# Patient Record
Sex: Female | Born: 1970 | Race: Black or African American | Hispanic: No | Smoking: Never smoker
Health system: Southern US, Community
[De-identification: ages and names within clinical notes are randomized; demographics above are authoritative.]

## PROBLEM LIST (undated history)

## (undated) DIAGNOSIS — M810 Age-related osteoporosis without current pathological fracture: Secondary | ICD-10-CM

## (undated) DIAGNOSIS — Z9889 Other specified postprocedural states: Secondary | ICD-10-CM

## (undated) DIAGNOSIS — R112 Nausea with vomiting, unspecified: Secondary | ICD-10-CM

## (undated) DIAGNOSIS — S8331XA Tear of articular cartilage of right knee, current, initial encounter: Secondary | ICD-10-CM

## (undated) DIAGNOSIS — J45909 Unspecified asthma, uncomplicated: Secondary | ICD-10-CM

## (undated) HISTORY — PX: OTHER SURGICAL HISTORY: SHX169

## (undated) HISTORY — PX: ABDOMINAL HYSTERECTOMY: SHX81

## (undated) HISTORY — PX: ABDOMINAL SURGERY: SHX537

---

## 2006-10-30 HISTORY — PX: GASTRIC BYPASS: SHX52

## 2006-10-30 HISTORY — PX: HERNIA REPAIR: SHX51

## 2007-03-19 ENCOUNTER — Ambulatory Visit: Payer: Self-pay | Admitting: Oncology

## 2007-04-26 ENCOUNTER — Ambulatory Visit (HOSPITAL_COMMUNITY): Admission: RE | Admit: 2007-04-26 | Discharge: 2007-04-26 | Payer: Self-pay | Admitting: *Deleted

## 2007-05-14 ENCOUNTER — Ambulatory Visit: Payer: Self-pay | Admitting: Oncology

## 2007-05-16 LAB — IRON AND TIBC
%SAT: 43 % (ref 20–55)
Iron: 112 ug/dL (ref 42–145)
UIBC: 150 ug/dL

## 2007-05-16 LAB — COMPREHENSIVE METABOLIC PANEL
AST: 22 U/L (ref 0–37)
Alkaline Phosphatase: 47 U/L (ref 39–117)
BUN: 14 mg/dL (ref 6–23)
Chloride: 105 mEq/L (ref 96–112)
Creatinine, Ser: 0.66 mg/dL (ref 0.40–1.20)
Glucose, Bld: 80 mg/dL (ref 70–99)
Potassium: 3.8 mEq/L (ref 3.5–5.3)
Sodium: 139 mEq/L (ref 135–145)
Total Bilirubin: 1.8 mg/dL — ABNORMAL HIGH (ref 0.3–1.2)
Total Protein: 6.3 g/dL (ref 6.0–8.3)

## 2007-08-05 ENCOUNTER — Emergency Department (HOSPITAL_COMMUNITY): Admission: EM | Admit: 2007-08-05 | Discharge: 2007-08-05 | Payer: Self-pay | Admitting: Emergency Medicine

## 2007-08-20 ENCOUNTER — Ambulatory Visit: Payer: Self-pay | Admitting: Oncology

## 2007-08-27 LAB — COMPREHENSIVE METABOLIC PANEL
AST: 16 U/L (ref 0–37)
Alkaline Phosphatase: 55 U/L (ref 39–117)
BUN: 11 mg/dL (ref 6–23)
CO2: 24 mEq/L (ref 19–32)
Calcium: 8.9 mg/dL (ref 8.4–10.5)
Creatinine, Ser: 0.64 mg/dL (ref 0.40–1.20)
Potassium: 3.7 mEq/L (ref 3.5–5.3)

## 2007-08-27 LAB — CBC WITH DIFFERENTIAL/PLATELET
Basophils Absolute: 0 10*3/uL (ref 0.0–0.1)
EOS%: 3.7 % (ref 0.0–7.0)
HGB: 12.4 g/dL (ref 11.6–15.9)
LYMPH%: 54.4 % — ABNORMAL HIGH (ref 14.0–48.0)
MCH: 31.9 pg (ref 26.0–34.0)
MCV: 90.3 fL (ref 81.0–101.0)
MONO%: 5.5 % (ref 0.0–13.0)
NEUT#: 1.6 10*3/uL (ref 1.5–6.5)
Platelets: 235 10*3/uL (ref 145–400)
RBC: 3.9 10*6/uL (ref 3.70–5.32)
WBC: 4.5 10*3/uL (ref 3.9–10.0)
lymph#: 2.5 10*3/uL (ref 0.9–3.3)

## 2007-08-27 LAB — IRON AND TIBC
TIBC: 267 ug/dL (ref 250–470)
UIBC: 176 ug/dL

## 2008-02-18 ENCOUNTER — Ambulatory Visit: Payer: Self-pay | Admitting: Oncology

## 2008-03-12 LAB — CBC WITH DIFFERENTIAL/PLATELET
BASO%: 0.5 % (ref 0.0–2.0)
Basophils Absolute: 0 10*3/uL (ref 0.0–0.1)
EOS%: 3.2 % (ref 0.0–7.0)
LYMPH%: 52 % — ABNORMAL HIGH (ref 14.0–48.0)
MCH: 31.2 pg (ref 26.0–34.0)
MCHC: 35.3 g/dL (ref 32.0–36.0)
MONO#: 0.2 10*3/uL (ref 0.1–0.9)
MONO%: 5.3 % (ref 0.0–13.0)
NEUT#: 1.4 10*3/uL — ABNORMAL LOW (ref 1.5–6.5)
RBC: 3.97 10*6/uL (ref 3.70–5.32)
WBC: 3.5 10*3/uL — ABNORMAL LOW (ref 3.9–10.0)

## 2008-03-12 LAB — COMPREHENSIVE METABOLIC PANEL
ALT: 30 U/L (ref 0–35)
AST: 37 U/L (ref 0–37)
Alkaline Phosphatase: 53 U/L (ref 39–117)
Creatinine, Ser: 0.74 mg/dL (ref 0.40–1.20)
Glucose, Bld: 79 mg/dL (ref 70–99)
Potassium: 3.9 mEq/L (ref 3.5–5.3)
Sodium: 141 mEq/L (ref 135–145)

## 2008-03-26 ENCOUNTER — Ambulatory Visit (HOSPITAL_COMMUNITY): Admission: RE | Admit: 2008-03-26 | Discharge: 2008-03-26 | Payer: Self-pay | Admitting: Oncology

## 2008-07-28 ENCOUNTER — Ambulatory Visit: Payer: Self-pay | Admitting: Internal Medicine

## 2008-07-28 DIAGNOSIS — J329 Chronic sinusitis, unspecified: Secondary | ICD-10-CM | POA: Insufficient documentation

## 2008-07-28 DIAGNOSIS — L509 Urticaria, unspecified: Secondary | ICD-10-CM

## 2008-07-28 LAB — CONVERTED CEMR LAB
Basophils Absolute: 0 10*3/uL (ref 0.0–0.1)
Eosinophils Absolute: 0.2 10*3/uL (ref 0.0–0.7)
Eosinophils Relative: 4.1 % (ref 0.0–5.0)
HCT: 38.5 % (ref 36.0–46.0)
Hemoglobin: 13 g/dL (ref 12.0–15.0)
IgE (Immunoglobulin E), Serum: 17 intl units/mL (ref 0.0–180.0)
Lymphocytes Relative: 54.9 % — ABNORMAL HIGH (ref 12.0–46.0)
Neutrophils Relative %: 36.1 % — ABNORMAL LOW (ref 43.0–77.0)
WBC: 4.8 10*3/uL (ref 4.5–10.5)

## 2008-08-08 DIAGNOSIS — J45909 Unspecified asthma, uncomplicated: Secondary | ICD-10-CM | POA: Insufficient documentation

## 2008-08-08 DIAGNOSIS — J309 Allergic rhinitis, unspecified: Secondary | ICD-10-CM | POA: Insufficient documentation

## 2008-08-18 ENCOUNTER — Ambulatory Visit: Payer: Self-pay | Admitting: Oncology

## 2008-08-20 LAB — CBC WITH DIFFERENTIAL/PLATELET
EOS%: 5.4 % (ref 0.0–7.0)
HCT: 35.8 % (ref 34.8–46.6)
HGB: 12.5 g/dL (ref 11.6–15.9)
LYMPH%: 49.5 % — ABNORMAL HIGH (ref 14.0–48.0)
MCHC: 34.8 g/dL (ref 32.0–36.0)
MCV: 90.4 fL (ref 81.0–101.0)
MONO#: 0.2 10*3/uL (ref 0.1–0.9)
NEUT#: 1.4 10*3/uL — ABNORMAL LOW (ref 1.5–6.5)
RBC: 3.96 10*6/uL (ref 3.70–5.32)
RDW: 13.2 % (ref 11.3–14.5)
WBC: 3.6 10*3/uL — ABNORMAL LOW (ref 3.9–10.0)

## 2008-08-20 LAB — PROTHROMBIN TIME
INR: 1.1 (ref 0.0–1.5)
Prothrombin Time: 14.4 seconds (ref 11.6–15.2)

## 2008-08-20 LAB — COMPREHENSIVE METABOLIC PANEL
AST: 21 U/L (ref 0–37)
Alkaline Phosphatase: 55 U/L (ref 39–117)
BUN: 10 mg/dL (ref 6–23)
Calcium: 8.8 mg/dL (ref 8.4–10.5)
Chloride: 108 mEq/L (ref 96–112)
Creatinine, Ser: 0.78 mg/dL (ref 0.40–1.20)
Total Protein: 6.5 g/dL (ref 6.0–8.3)

## 2008-08-20 LAB — IRON AND TIBC
Iron: 105 ug/dL (ref 42–145)
TIBC: 266 ug/dL (ref 250–470)
UIBC: 161 ug/dL

## 2008-08-20 LAB — FERRITIN: Ferritin: 996 ng/mL — ABNORMAL HIGH (ref 10–291)

## 2008-08-20 LAB — APTT: aPTT: 34 seconds (ref 24–37)

## 2008-08-31 ENCOUNTER — Ambulatory Visit: Payer: Self-pay | Admitting: Internal Medicine

## 2009-05-19 IMAGING — CT CT HEAD W/O CM
1 of 2 series · 13 of 30 positions shown, 17 images · IV contrast (agent unspecified)
Comparison: None.

CLINICAL DATA: Headaches.
 HEAD CT WITHOUT CONTRAST:
TECHNIQUE: Contiguous axial images were obtained from the base of the skull through the vertex according to standard protocol without contrast.

[Series 2: brain · axial · 0.47mm/px · z∈[+157,+277]mm · 13 of 28 slices shown, 17 images]
[im 2/28  brain]
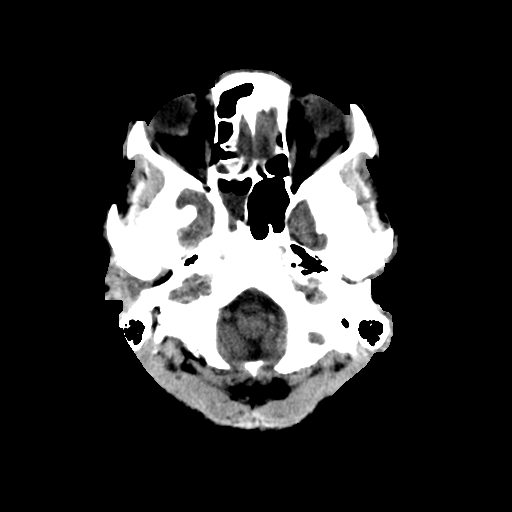
[im 2/28  bone]
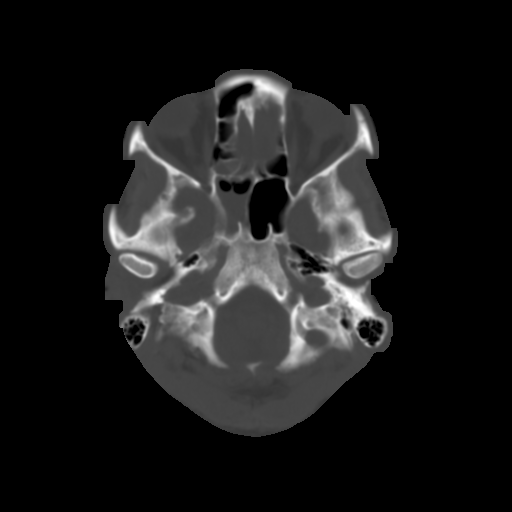
[im 4/28  brain]
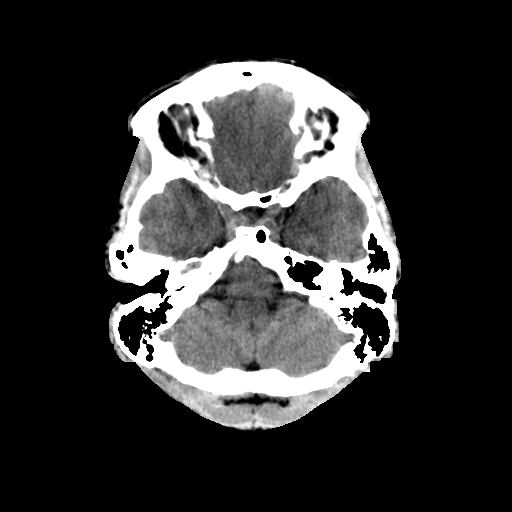
[im 6/28  brain]
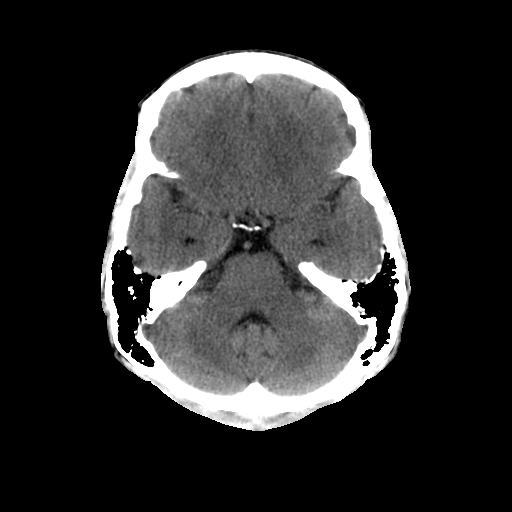
[im 8/28  brain]
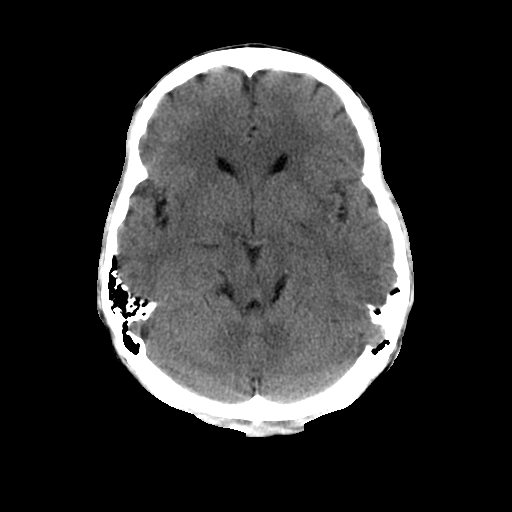
[im 10/28  brain]
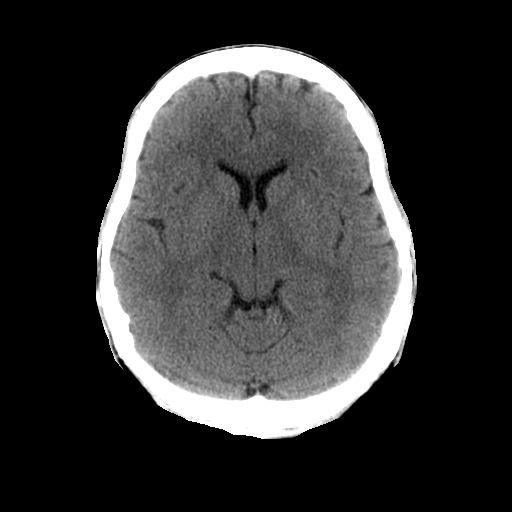
[im 10/28  bone]
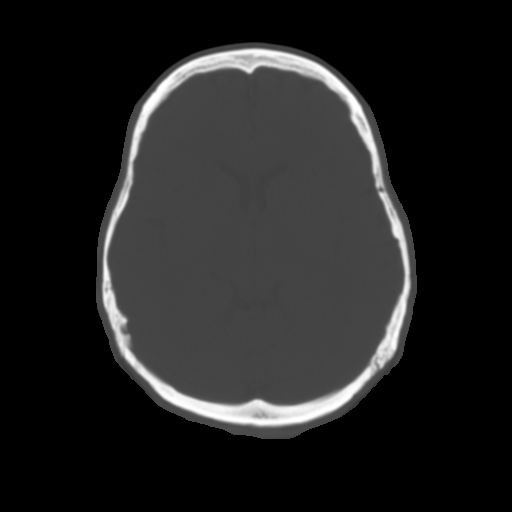
[im 12/28  brain]
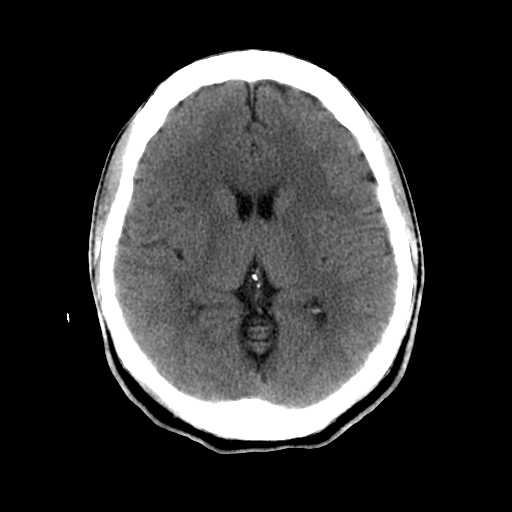
[im 14/28  brain]
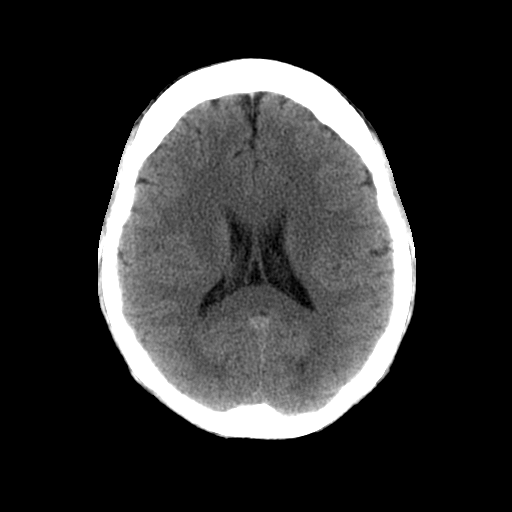
[im 16/28  brain]
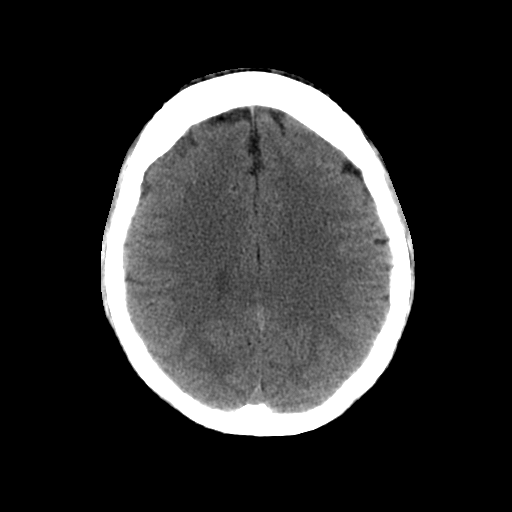
[im 18/28  brain]
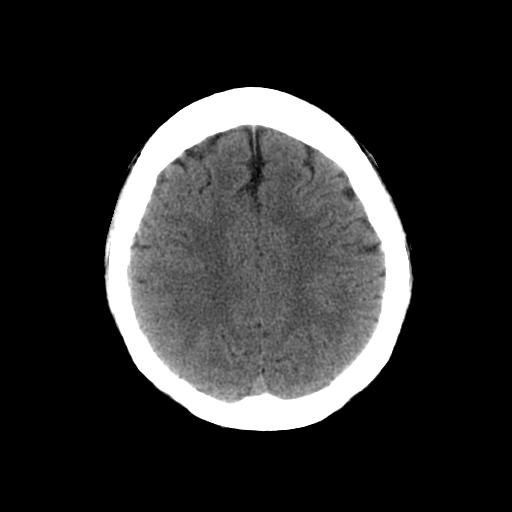
[im 18/28  bone]
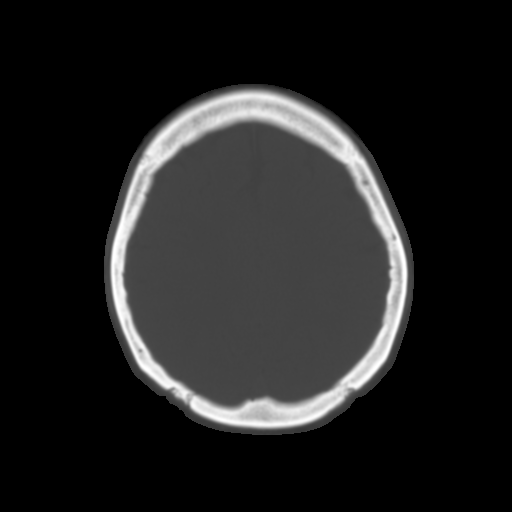
[im 20/28  brain]
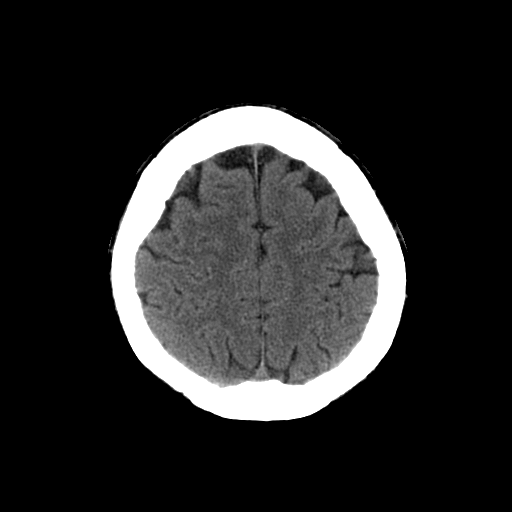
[im 22/28  brain]
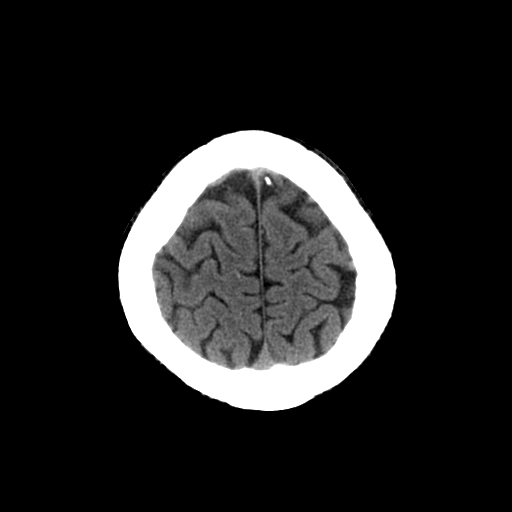
[im 24/28  brain]
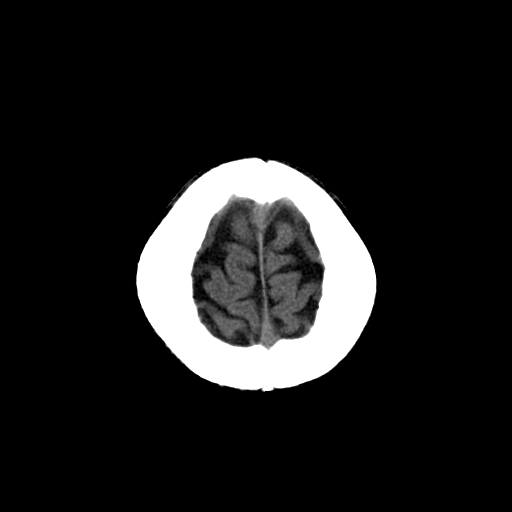
[im 26/28  brain]
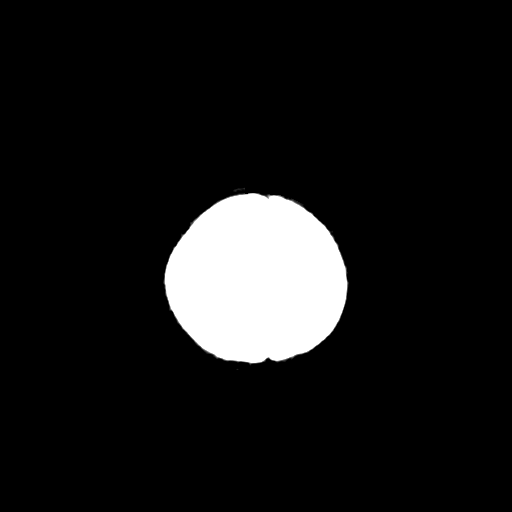
[im 26/28  bone]
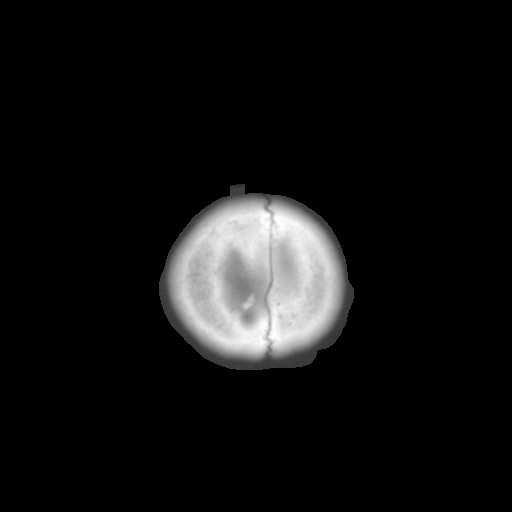

[13 of 30 positions shown; findings below may reference images not displayed]

FINDINGS: There is no evidence of intracranial hemorrhage, brain edema, acute infarct, mass lesion, or mass effect.  No other intra-axial abnormalities are seen, and the ventricles are within normal limits.  No abnormal extra-axial fluid collections or masses are identified.  No skull abnormalities are noted.  There is air-fluid level within the sphenoid sinus and there is partial opacification of the ethmoid air cells.
IMPRESSION: 1.  No acute intracranial abnormality.
 2.  Acute on chronic sinus inflammation.

## 2009-05-31 ENCOUNTER — Other Ambulatory Visit: Admission: RE | Admit: 2009-05-31 | Discharge: 2009-05-31 | Payer: Self-pay | Admitting: Family Medicine

## 2009-06-03 ENCOUNTER — Encounter: Admission: RE | Admit: 2009-06-03 | Discharge: 2009-06-03 | Payer: Self-pay | Admitting: Family Medicine

## 2010-01-23 ENCOUNTER — Emergency Department (HOSPITAL_COMMUNITY): Admission: EM | Admit: 2010-01-23 | Discharge: 2010-01-23 | Payer: Self-pay | Admitting: Emergency Medicine

## 2010-02-07 ENCOUNTER — Other Ambulatory Visit: Admission: RE | Admit: 2010-02-07 | Discharge: 2010-02-07 | Payer: Self-pay | Admitting: Obstetrics and Gynecology

## 2010-06-13 ENCOUNTER — Encounter: Admission: RE | Admit: 2010-06-13 | Discharge: 2010-06-13 | Payer: Self-pay | Admitting: Family Medicine

## 2010-08-15 ENCOUNTER — Other Ambulatory Visit: Admission: RE | Admit: 2010-08-15 | Discharge: 2010-08-15 | Payer: Self-pay | Admitting: Obstetrics and Gynecology

## 2011-03-14 NOTE — Op Note (Signed)
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   PROCEDURES:  1. Exploration of the abdominal wall.  2. Plication of the rectus muscle.   SURGEON:  Alfonse Ras, MD   ANESTHESIA:  General.   DESCRIPTION:  The patient was taken to the operating room and placed in  a supine position.  After adequate general anesthesia was induced using  endotracheal tube, the abdomen was prepped and draped in a normal  sterile fashion.  Using a transverse supraumbilical incision, I  dissected down through the subcutaneous tissue onto a lipoma, which was  easily excised.  The umbilical stalk was inspected.  The fascia was  inspected and was somewhat attenuated, but there was no evidence of a  true hernia defect.  At the area of attenuation, which was quite small  in the supraumbilical region, this was plicated with interrupted 0  Surgilon sutures in figure-of-eight fashion.  Adequate hemostasis was  assured.  Of note, Thomas A. Cornett, M.D., scrubbed in and also could  not feel a fascial defect.  The skin was closed with subcuticular 4-0  Monocryl.  Steri-Strips, sterile dressings were applied.  The patient  tolerated the procedure well and went to PACU in good condition.      Alfonse Ras, MD  Electronically Signed     KRE/MEDQ  D:  04/26/2007  T:  04/27/2007  Job:  [id number]

## 2011-07-04 ENCOUNTER — Other Ambulatory Visit: Payer: Self-pay | Admitting: Family Medicine

## 2011-07-04 DIAGNOSIS — Z1231 Encounter for screening mammogram for malignant neoplasm of breast: Secondary | ICD-10-CM

## 2011-07-24 ENCOUNTER — Ambulatory Visit: Payer: Self-pay

## 2011-07-31 ENCOUNTER — Ambulatory Visit: Payer: Self-pay

## 2011-08-03 ENCOUNTER — Ambulatory Visit: Payer: Self-pay

## 2011-09-11 ENCOUNTER — Ambulatory Visit
Admission: RE | Admit: 2011-09-11 | Discharge: 2011-09-11 | Disposition: A | Discharge status: Home / Self Care | Payer: Federal, State, Local not specified - PPO | Source: Ambulatory Visit | Attending: Family Medicine | Admitting: Family Medicine

## 2011-09-11 DIAGNOSIS — Z1231 Encounter for screening mammogram for malignant neoplasm of breast: Secondary | ICD-10-CM

## 2011-10-03 ENCOUNTER — Other Ambulatory Visit: Payer: Self-pay | Admitting: Obstetrics and Gynecology

## 2011-10-03 ENCOUNTER — Other Ambulatory Visit (HOSPITAL_COMMUNITY)
Admission: RE | Admit: 2011-10-03 | Discharge: 2011-10-03 | Disposition: A | Discharge status: Home / Self Care | Payer: Federal, State, Local not specified - PPO | Source: Ambulatory Visit | Attending: Obstetrics and Gynecology | Admitting: Obstetrics and Gynecology

## 2011-10-03 DIAGNOSIS — Z01419 Encounter for gynecological examination (general) (routine) without abnormal findings: Secondary | ICD-10-CM | POA: Insufficient documentation

## 2012-04-29 ENCOUNTER — Emergency Department (HOSPITAL_COMMUNITY): Payer: Federal, State, Local not specified - PPO

## 2012-04-29 ENCOUNTER — Emergency Department (HOSPITAL_COMMUNITY)
Admission: EM | Admit: 2012-04-29 | Discharge: 2012-04-29 | Disposition: A | Discharge status: Home / Self Care | Payer: Federal, State, Local not specified - PPO | Attending: Emergency Medicine | Admitting: Emergency Medicine

## 2012-04-29 ENCOUNTER — Encounter (HOSPITAL_COMMUNITY): Payer: Self-pay | Admitting: Emergency Medicine

## 2012-04-29 DIAGNOSIS — Z79899 Other long term (current) drug therapy: Secondary | ICD-10-CM | POA: Insufficient documentation

## 2012-04-29 DIAGNOSIS — M79609 Pain in unspecified limb: Secondary | ICD-10-CM | POA: Insufficient documentation

## 2012-04-29 DIAGNOSIS — M79604 Pain in right leg: Secondary | ICD-10-CM

## 2012-04-29 DIAGNOSIS — M25569 Pain in unspecified knee: Secondary | ICD-10-CM | POA: Insufficient documentation

## 2012-04-29 DIAGNOSIS — M79641 Pain in right hand: Secondary | ICD-10-CM

## 2012-04-29 HISTORY — DX: Unspecified asthma, uncomplicated: J45.909

## 2012-04-29 MED ORDER — OXYCODONE-ACETAMINOPHEN 5-325 MG PO TABS: 2.0000 | ORAL_TABLET | ORAL | Status: AC | PRN

## 2012-04-29 MED ORDER — OXYCODONE-ACETAMINOPHEN 5-325 MG PO TABS
2.0000 | ORAL_TABLET | Freq: Once | ORAL | Status: AC
Start: 2012-04-29 — End: 2012-04-29
  Administered 2012-04-29: 2 via ORAL
  Filled 2012-04-29 (×2): qty 1

## 2012-04-29 MED ORDER — NAPROXEN 375 MG PO TABS: 375.0000 mg | ORAL_TABLET | Freq: Two times a day (BID) | ORAL | Status: DC

## 2012-04-29 NOTE — ED Notes (Signed)
Pt complaining of right hand pain. Pt states she was using a screw driver and her hand slipped and she hit the wall with her hand. No swelling redness or deformity noted. Pt just states painful movement. Pt also complaining of right leg pain. Pt states that the pain travels from her hip to her ankle. No redness, swelling or deformity noted. Pt ambulatory pt states previous history of right knee problems with cartilage.

## 2012-04-29 NOTE — ED Notes (Signed)
C/o R hand pain x 1 week since hitting it on microwave cart.  Reports R hip pain that radiates down R leg to R ankle since Tuesday.  No known injury to leg.

## 2012-04-29 NOTE — Discharge Instructions (Signed)
Osteoarthritis   Osteoarthritis is the most common form of arthritis. It is redness, soreness, and swelling (inflammation) affecting the cartilage. Cartilage acts as a cushion, covering the ends of bones where they meet to form a joint.   CAUSES   Over time, the cartilage begins to wear away. This causes bone to rub on bone. This produces pain and stiffness in the affected joints. Factors that contribute to this problem are:   Excessive body weight.   Age.   Overuse of joints.  SYMPTOMS   People with osteoarthritis usually experience joint pain, swelling, or stiffness.   Over time, the joint may lose its normal shape.   Small deposits of bone (osteophytes) may grow on the edges of the joint.   Bits of bone or cartilage can break off and float inside the joint space. This may cause more pain and damage.   Osteoarthritis can lead to depression, anxiety, feelings of helplessness, and limitations on daily activities.  The most commonly affected joints are in the:   Ends of the fingers.   Thumbs.   Neck.   Lower back.   Knees.   Hips.  DIAGNOSIS   Diagnosis is mostly based on your symptoms and exam. Tests may be helpful, including:   X-rays of the affected joint.   A computerized magnetic scan (MRI).   Blood tests to rule out other types of arthritis.   Joint fluid tests. This involves using a needle to draw fluid from the joint and examining the fluid under a microscope.  TREATMENT   Goals of treatment are to control pain, improve joint function, maintain a normal body weight, and maintain a healthy lifestyle. Treatment approaches may include:   A prescribed exercise program with rest and joint relief.   Weight control with nutritional education.   Pain relief techniques such as:   Properly applied heat and cold.   Electric pulses delivered to nerve endings under the skin (transcutaneous electrical nerve stimulation, TENS).   Massage.    Certain supplements. Ask your caregiver before using any supplements, especially in combination with prescribed drugs.   Medicines to control pain, such as:   Acetaminophen.   Nonsteroidal anti-inflammatory drugs (NSAIDs), such as naproxen.   Narcotic or central-acting agents, such as tramadol. This drug carries a risk of addiction and is generally prescribed for short-term use.   Corticosteroids. These can be given orally or as injection. This is a short-term treatment, not recommended for routine use.   Surgery to reposition the bones and relieve pain (osteotomy) or to remove loose pieces of bone and cartilage. Joint replacement may be needed in advanced states of osteoarthritis.  HOME CARE INSTRUCTIONS   Your caregiver can recommend specific types of exercise. These may include:   Strengthening exercises. These are done to strengthen the muscles that support joints affected by arthritis. They can be performed with weights or with exercise bands to add resistance.   Aerobic activities. These are exercises, such as brisk walking or low-impact aerobics, that get your heart pumping. They can help keep your lungs and circulatory system in shape.   Range-of-motion activities. These keep your joints limber.   Balance and agility exercises. These help you maintain daily living skills.  Learning about your condition and being actively involved in your care will help improve the course of your osteoarthritis.   SEEK MEDICAL CARE IF:   You feel hot or your skin turns red.   You develop a rash in addition to your joint 

## 2012-04-29 NOTE — ED Provider Notes (Signed)
History     CSN: [id number]  Arrival date & time 04/29/12  0236   First MD Initiated Contact with Patient 04/29/12 [id number]      Chief Complaint  Patient presents with  . Leg Pain  . Hand Pain    (Consider location/radiation/quality/duration/timing/severity/associated sxs/prior treatment) HPI History provided by patient. States she is followed by orthopedic surgeon for history of knee problems and arthritis. She is prescribed medications for this. The last few days she's been having worsening right knee pain that radiates to her lower thigh and sometimes down her leg. She denies any back pain or injury. She has also been having right hand pain for the last week after striking it on the microwave. No associated swelling to hand or to knee. No rashes or redness. No fevers or chills.   Past Medical History  Diagnosis Date  . Asthma     Past Surgical History  Procedure Date  . Abdominal hysterectomy     No family history on file.  History  Substance Use Topics  . Smoking status: Never Smoker   . Smokeless tobacco: Not on file  . Alcohol Use: Yes    OB History    Grav Para Term Preterm Abortions TAB SAB Ect Mult Living                  Review of Systems  Constitutional: Negative for fever and chills.  HENT: Negative for neck pain and neck stiffness.   Eyes: Negative for pain.  Respiratory: Negative for shortness of breath.   Cardiovascular: Negative for chest pain.  Gastrointestinal: Negative for abdominal pain.  Genitourinary: Negative for dysuria.  Musculoskeletal: Negative for back pain.  Skin: Negative for rash and wound.  Neurological: Negative for headaches.  All other systems reviewed and are negative.    Allergies  Aspirin  Home Medications   Current Outpatient Rx  Name Route Sig Dispense Refill  . ALBUTEROL SULFATE HFA 108 (90 BASE) MCG/ACT IN AERS Inhalation Inhale 2 puffs into the lungs every 6 (six) hours as needed. For wheeze or shortness of breath     . BACLOFEN 10 MG PO TABS Oral Take 10 mg by mouth 3 (three) times daily.    Marland Kitchen LORATADINE 10 MG PO TABS Oral Take 10 mg by mouth daily.    . ADULT MULTIVITAMIN W/MINERALS CH Oral Take 1 tablet by mouth daily.    . TOPIRAMATE 100 MG PO TABS Oral Take 100 mg by mouth daily.    Marland Kitchen VITAMIN B-12 100 MCG PO TABS Oral Take 50 mcg by mouth daily.    Marland Kitchen VITAMIN D (ERGOCALCIFEROL) 50000 UNITS PO CAPS Oral Take 50,000 Units by mouth. Mondays      BP 99/55  Pulse 71  Temp 98.4 F (36.9 C) (Oral)  Resp 18  SpO2 100%  Physical Exam  Constitutional: She is oriented to person, place, and time. She appears well-developed and well-nourished.  HENT:  Head: Normocephalic and atraumatic.  Eyes: Conjunctivae and EOM are normal. Pupils are equal, round, and reactive to light.  Neck: Trachea normal. Neck supple. No thyromegaly present.  Cardiovascular: Normal rate, regular rhythm, S1 normal, S2 normal and normal pulses.     No systolic murmur is present   No diastolic murmur is present  Pulses:      Radial pulses are 2+ on the right side, and 2+ on the left side.  Pulmonary/Chest: Effort normal and breath sounds normal. She has no wheezes. She has no rhonchi. She  has no rales. She exhibits no tenderness.  Abdominal: Soft. Normal appearance and bowel sounds are normal. There is no tenderness. There is no CVA tenderness and negative Murphy's sign.  Musculoskeletal:       Right lower extremity with localized pain over the patella. There is mild effusion. No increased warmth to touch or erythema. No joint instability. No soft tissue tenderness or swelling. Distal neurovascular intact. Nontender over hip pelvis and lower back. Right upper extremity with localized tenderness over fourth and fifth metacarpals without any bony deformity, swelling or erythema. Full range of motion throughout. No wrist tenderness. Distal neurovascular intact.  Neurological: She is alert and oriented to person, place, and time. She has  normal strength. No cranial nerve deficit or sensory deficit. GCS eye subscore is 4. GCS verbal subscore is 5. GCS motor subscore is 6.  Skin: Skin is warm and dry. No rash noted. She is not diaphoretic.  Psychiatric: Her speech is normal.       Cooperative and appropriate    ED Course  Procedures (including critical care time)  Labs Reviewed - No data to display Dg Knee Complete 4 Views Right  04/29/2012  *RADIOLOGY REPORT*  Clinical Data: Right knee pain  RIGHT KNEE - COMPLETE 4+ VIEW  Comparison: 07/11/2011 MRI  Findings: Mild subchondral cystic change along the tibia anteriorly, at the ACL insertion site . No acute fracture or dislocation.  No aggressive osseous lesion. There may be a tiny joint effusion.  IMPRESSION: Subchondral cystic change near the ACL insertion site.  No acute osseous abnormality identified.  Original Report Authenticated By: Waneta Martins, M.D.   Dg Hand Complete Right  04/29/2012  *RADIOLOGY REPORT*  Clinical Data: Right hand pain  RIGHT HAND - COMPLETE 3+ VIEW  Comparison: None  Findings: No displaced fracture.  No dislocation.  No aggressive osseous lesions.  IMPRESSION: No acute osseous abnormality identified. If clinical concern for a fracture persists, recommend a repeat radiograph in 5-10 days to evaluate for interval change or callus formation.  Original Report Authenticated By: Waneta Martins, M.D.     Percocet by mouth. Imaging obtained and reviewed as above. On recheck pain improved. Patient requesting crutches as needed. Splint and Ace wrap provided. Patient has orthopedic followup and agrees to call morning for recheck in the clinic. She is unaware of a cyst identified on the films in the past. She relates that her last x-ray was about a year ago. She takes Tylenol currently for pain and agrees to start taking NSAIDs like Motrin or Aleve. Prescription for 6 Percocet provided   MDM   Knee pain and hand pain improved with Percocet. Imaging as above.  Plan orthopedic followup. No identified fractures. Strict return precautions provided for any worsening condition.        Sunnie Nielsen, MD 04/29/12 [phone]

## 2012-05-07 ENCOUNTER — Encounter (HOSPITAL_BASED_OUTPATIENT_CLINIC_OR_DEPARTMENT_OTHER): Payer: Self-pay | Admitting: *Deleted

## 2012-05-07 NOTE — Progress Notes (Signed)
No labs needed-to bring crutches  

## 2012-05-09 ENCOUNTER — Other Ambulatory Visit: Payer: Self-pay | Admitting: Orthopedic Surgery

## 2012-05-10 ENCOUNTER — Encounter (HOSPITAL_BASED_OUTPATIENT_CLINIC_OR_DEPARTMENT_OTHER): Payer: Self-pay | Admitting: Anesthesiology

## 2012-05-10 ENCOUNTER — Encounter (HOSPITAL_BASED_OUTPATIENT_CLINIC_OR_DEPARTMENT_OTHER)
Admission: RE | Disposition: A | Discharge status: Home / Self Care | Payer: Self-pay | Source: Ambulatory Visit | Attending: Orthopedic Surgery

## 2012-05-10 ENCOUNTER — Encounter (HOSPITAL_BASED_OUTPATIENT_CLINIC_OR_DEPARTMENT_OTHER): Payer: Self-pay

## 2012-05-10 ENCOUNTER — Ambulatory Visit (HOSPITAL_BASED_OUTPATIENT_CLINIC_OR_DEPARTMENT_OTHER)
Admission: RE | Admit: 2012-05-10 | Discharge: 2012-05-10 | Disposition: A | Discharge status: Home / Self Care | Payer: Federal, State, Local not specified - PPO | Source: Ambulatory Visit | Attending: Orthopedic Surgery | Admitting: Orthopedic Surgery

## 2012-05-10 ENCOUNTER — Ambulatory Visit (HOSPITAL_BASED_OUTPATIENT_CLINIC_OR_DEPARTMENT_OTHER): Payer: Federal, State, Local not specified - PPO | Admitting: Anesthesiology

## 2012-05-10 ENCOUNTER — Encounter (HOSPITAL_BASED_OUTPATIENT_CLINIC_OR_DEPARTMENT_OTHER): Payer: Self-pay | Admitting: Orthopedic Surgery

## 2012-05-10 DIAGNOSIS — S8331XA Tear of articular cartilage of right knee, current, initial encounter: Secondary | ICD-10-CM

## 2012-05-10 DIAGNOSIS — J45909 Unspecified asthma, uncomplicated: Secondary | ICD-10-CM | POA: Insufficient documentation

## 2012-05-10 DIAGNOSIS — M810 Age-related osteoporosis without current pathological fracture: Secondary | ICD-10-CM | POA: Insufficient documentation

## 2012-05-10 DIAGNOSIS — M2240 Chondromalacia patellae, unspecified knee: Secondary | ICD-10-CM | POA: Insufficient documentation

## 2012-05-10 HISTORY — DX: Nausea with vomiting, unspecified: R11.2

## 2012-05-10 HISTORY — DX: Tear of articular cartilage of right knee, current, initial encounter: S83.31XA

## 2012-05-10 HISTORY — DX: Other specified postprocedural states: Z98.890

## 2012-05-10 HISTORY — PX: KNEE ARTHROSCOPY: SHX127

## 2012-05-10 HISTORY — DX: Age-related osteoporosis without current pathological fracture: M81.0

## 2012-05-10 LAB — POCT HEMOGLOBIN-HEMACUE: Hemoglobin: 14.2 g/dL (ref 12.0–15.0)

## 2012-05-10 SURGERY — ARTHROSCOPY, KNEE
Anesthesia: General | Site: Knee | Laterality: Right | Wound class: Clean

## 2012-05-10 MED ORDER — DEXAMETHASONE SODIUM PHOSPHATE 4 MG/ML IJ SOLN
INTRAMUSCULAR | Status: DC | PRN
  Administered 2012-05-10: 10 mg via INTRAVENOUS

## 2012-05-10 MED ORDER — PROPOFOL 10 MG/ML IV EMUL
INTRAVENOUS | Status: DC | PRN
  Administered 2012-05-10: 200 mg via INTRAVENOUS

## 2012-05-10 MED ORDER — METOCLOPRAMIDE HCL 5 MG/ML IJ SOLN
INTRAMUSCULAR | Status: DC | PRN
  Administered 2012-05-10: 10 mg via INTRAVENOUS

## 2012-05-10 MED ORDER — CEFAZOLIN SODIUM-DEXTROSE 2-3 GM-% IV SOLR: 2.0000 g | INTRAVENOUS | Status: DC

## 2012-05-10 MED ORDER — MIDAZOLAM HCL 2 MG/2ML IJ SOLN
1.0000 mg | INTRAMUSCULAR | Status: DC | PRN
  Administered 2012-05-10: 2 mg via INTRAVENOUS

## 2012-05-10 MED ORDER — ROPIVACAINE HCL 5 MG/ML IJ SOLN
INTRAMUSCULAR | Status: DC | PRN
  Administered 2012-05-10: 20 mL via EPIDURAL

## 2012-05-10 MED ORDER — LIDOCAINE HCL 1 % IJ SOLN
INTRAMUSCULAR | Status: DC | PRN
  Administered 2012-05-10: 2 mL via INTRADERMAL

## 2012-05-10 MED ORDER — ONDANSETRON HCL 4 MG/2ML IJ SOLN
INTRAMUSCULAR | Status: DC | PRN
  Administered 2012-05-10: 4 mg via INTRAVENOUS

## 2012-05-10 MED ORDER — SODIUM CHLORIDE 0.9 % IR SOLN
Status: DC | PRN
  Administered 2012-05-10: 6000 mL

## 2012-05-10 MED ORDER — METHOCARBAMOL 500 MG PO TABS: 500.0000 mg | ORAL_TABLET | Freq: Four times a day (QID) | ORAL | Status: AC

## 2012-05-10 MED ORDER — ACETAMINOPHEN 10 MG/ML IV SOLN: 1000.0000 mg | Freq: Once | INTRAVENOUS | Status: DC

## 2012-05-10 MED ORDER — FENTANYL CITRATE 0.05 MG/ML IJ SOLN
50.0000 ug | INTRAMUSCULAR | Status: DC | PRN
  Administered 2012-05-10: 100 ug via INTRAVENOUS

## 2012-05-10 MED ORDER — OXYCODONE HCL 5 MG PO TABS
5.0000 mg | ORAL_TABLET | Freq: Once | ORAL | Status: AC | PRN
  Administered 2012-05-10: 5 mg via ORAL

## 2012-05-10 MED ORDER — OXYCODONE-ACETAMINOPHEN 10-325 MG PO TABS: 1.0000 | ORAL_TABLET | Freq: Four times a day (QID) | ORAL | Status: AC | PRN

## 2012-05-10 MED ORDER — METOCLOPRAMIDE HCL 5 MG/ML IJ SOLN: 10.0000 mg | Freq: Once | INTRAMUSCULAR | Status: DC | PRN

## 2012-05-10 MED ORDER — LIDOCAINE HCL (CARDIAC) 20 MG/ML IV SOLN
INTRAVENOUS | Status: DC | PRN
  Administered 2012-05-10: 50 mg via INTRAVENOUS

## 2012-05-10 MED ORDER — LACTATED RINGERS IV SOLN
INTRAVENOUS | Status: DC
  Administered 2012-05-10 (×2): via INTRAVENOUS

## 2012-05-10 MED ORDER — HYDROMORPHONE HCL PF 1 MG/ML IJ SOLN
0.2500 mg | INTRAMUSCULAR | Status: DC | PRN
  Administered 2012-05-10 (×4): 0.5 mg via INTRAVENOUS

## 2012-05-10 MED ORDER — ATROPINE SULFATE 0.4 MG/ML IJ SOLN: 0.4000 mg | Freq: Once | INTRAMUSCULAR | Status: DC | PRN

## 2012-05-10 MED ORDER — PROMETHAZINE HCL 25 MG PO TABS: 25.0000 mg | ORAL_TABLET | Freq: Four times a day (QID) | ORAL | Status: DC | PRN

## 2012-05-10 SURGICAL SUPPLY — 78 items
APL SKNCLS STERI-STRIP NONHPOA (GAUZE/BANDAGES/DRESSINGS) ×2
BANDAGE ELASTIC 6 VELCRO ST LF (GAUZE/BANDAGES/DRESSINGS) ×2 IMPLANT
BANDAGE ESMARK 6X9 LF (GAUZE/BANDAGES/DRESSINGS) IMPLANT
BENZOIN TINCTURE PRP APPL 2/3 (GAUZE/BANDAGES/DRESSINGS) ×3 IMPLANT
BLADE 4.2CUDA (BLADE) IMPLANT
BLADE CUDA GRT WHITE 3.5 (BLADE) IMPLANT
BLADE CUDA SHAVER 3.5 (BLADE) IMPLANT
BLADE CUTTER GATOR 3.5 (BLADE) ×3 IMPLANT
BLADE GREAT WHITE 4.2 (BLADE) IMPLANT
BLADE SURG 15 STRL LF DISP TIS (BLADE) ×1 IMPLANT
BLADE SURG 15 STRL SS (BLADE)
BNDG CMPR 9X6 STRL LF SNTH (GAUZE/BANDAGES/DRESSINGS)
BNDG ESMARK 6X9 LF (GAUZE/BANDAGES/DRESSINGS)
BUR OVAL 4.0 (BURR) ×1 IMPLANT
BUR OVAL 6.0 (BURR) IMPLANT
CANISTER OMNI JUG 16 LITER (MISCELLANEOUS) ×3 IMPLANT
CANISTER SUCTION 2500CC (MISCELLANEOUS) IMPLANT
CLOTH BEACON ORANGE TIMEOUT ST (SAFETY) ×3 IMPLANT
COVER TABLE BACK 60X90 (DRAPES) ×2 IMPLANT
CUFF TOURNIQUET SINGLE 34IN LL (TOURNIQUET CUFF) ×2 IMPLANT
CUTTER MENISCUS  4.2MM (BLADE)
CUTTER MENISCUS 4.2MM (BLADE) IMPLANT
DECANTER SPIKE VIAL GLASS SM (MISCELLANEOUS) IMPLANT
DRAPE ARTHROSCOPY W/POUCH 114 (DRAPES) ×3 IMPLANT
DRAPE OEC MINIVIEW 54X84 (DRAPES) ×1 IMPLANT
DRAPE U-SHAPE 47X51 STRL (DRAPES) ×1 IMPLANT
DURAPREP 26ML APPLICATOR (WOUND CARE) ×3 IMPLANT
ELECT REM PT RETURN 9FT ADLT (ELECTROSURGICAL)
ELECTRODE REM PT RTRN 9FT ADLT (ELECTROSURGICAL) ×1 IMPLANT
FIBERSTICK 2 (SUTURE) ×1 IMPLANT
GLOVE BIO SURGEON STRL SZ 6.5 (GLOVE) ×4 IMPLANT
GLOVE BIO SURGEON STRL SZ8 (GLOVE) ×3 IMPLANT
GLOVE BIOGEL PI IND STRL 7.0 (GLOVE) ×1 IMPLANT
GLOVE BIOGEL PI IND STRL 8 (GLOVE) ×5 IMPLANT
GLOVE BIOGEL PI INDICATOR 7.0 (GLOVE) ×1
GLOVE BIOGEL PI INDICATOR 8 (GLOVE) ×2
GLOVE ORTHO TXT STRL SZ7.5 (GLOVE) ×3 IMPLANT
GOWN PREVENTION PLUS XLARGE (GOWN DISPOSABLE) ×5 IMPLANT
GOWN PREVENTION PLUS XXLARGE (GOWN DISPOSABLE) ×3 IMPLANT
HOLDER KNEE FOAM BLUE (MISCELLANEOUS) ×3 IMPLANT
IMMOBILIZER KNEE 22  40 CIR (ORTHOPEDIC SUPPLIES) ×1
IMMOBILIZER KNEE 22 40 CIR (ORTHOPEDIC SUPPLIES) ×1 IMPLANT
IMMOBILIZER KNEE 22 UNIV (SOFTGOODS) ×1 IMPLANT
IMMOBILIZER KNEE 24 THIGH 36 (MISCELLANEOUS) ×1 IMPLANT
IMMOBILIZER KNEE 24 UNIV (MISCELLANEOUS)
KIT TRANSTIBIAL (DISPOSABLE) IMPLANT
KNEE WRAP E Z 3 GEL PACK (MISCELLANEOUS) ×3 IMPLANT
NS IRRIG 1000ML POUR BTL (IV SOLUTION) ×1 IMPLANT
PACK ARTHROSCOPY DSU (CUSTOM PROCEDURE TRAY) ×3 IMPLANT
PACK BASIN DAY SURGERY FS (CUSTOM PROCEDURE TRAY) ×3 IMPLANT
PAD CAST 4YDX4 CTTN HI CHSV (CAST SUPPLIES) ×1 IMPLANT
PADDING CAST COTTON 4X4 STRL (CAST SUPPLIES)
PADDING CAST COTTON 6X4 STRL (CAST SUPPLIES) ×3 IMPLANT
PENCIL BUTTON HOLSTER BLD 10FT (ELECTRODE) IMPLANT
SET ARTHROSCOPY TUBING (MISCELLANEOUS) ×3
SET ARTHROSCOPY TUBING LN (MISCELLANEOUS) ×2 IMPLANT
SLEEVE SCD COMPRESS KNEE MED (MISCELLANEOUS) ×3 IMPLANT
SPONGE GAUZE 4X4 12PLY (GAUZE/BANDAGES/DRESSINGS) ×3 IMPLANT
SPONGE LAP 4X18 X RAY DECT (DISPOSABLE) ×1 IMPLANT
STRIP CLOSURE SKIN 1/2X4 (GAUZE/BANDAGES/DRESSINGS) ×3 IMPLANT
SUCTION FRAZIER TIP 10 FR DISP (SUCTIONS) IMPLANT
SUT 2 FIBERLOOP 20 STRT BLUE (SUTURE)
SUT FIBERWIRE #2 38 T-5 BLUE (SUTURE)
SUT MNCRL AB 4-0 PS2 18 (SUTURE) ×2 IMPLANT
SUT VIC AB 0 CT1 27 (SUTURE)
SUT VIC AB 0 CT1 27XBRD ANBCTR (SUTURE) IMPLANT
SUT VIC AB 2-0 SH 27 (SUTURE)
SUT VIC AB 2-0 SH 27XBRD (SUTURE) IMPLANT
SUT VIC AB 3-0 SH 27 (SUTURE)
SUT VIC AB 3-0 SH 27X BRD (SUTURE) IMPLANT
SUT VICRYL 3-0 CR8 SH (SUTURE) IMPLANT
SUT VICRYL 4-0 PS2 18IN ABS (SUTURE) IMPLANT
SUTURE 2 FIBERLOOP 20 STRT BLU (SUTURE) ×2 IMPLANT
SUTURE FIBERWR #2 38 T-5 BLUE (SUTURE) ×2 IMPLANT
TOWEL OR 17X24 6PK STRL BLUE (TOWEL DISPOSABLE) ×3 IMPLANT
TOWEL OR NON WOVEN STRL DISP B (DISPOSABLE) ×3 IMPLANT
WAND STAR VAC 90 (SURGICAL WAND) ×3 IMPLANT
WATER STERILE IRR 1000ML POUR (IV SOLUTION) ×7 IMPLANT

## 2012-05-10 NOTE — Anesthesia Procedure Notes (Addendum)
Anesthesia Regional Block:  Femoral nerve block  Pre-Anesthetic Checklist: ,, timeout performed, Correct Patient, Correct Site, Correct Laterality, Correct Procedure, Correct Position, site marked, Risks and benefits discussed,  Surgical consent,  Pre-op evaluation,  At surgeon's request and post-op pain management  Laterality: Right  Prep: chloraprep       Needles:   Needle Type: Other   (Arrow Echogenic)   Needle Length: 9cm  Needle Gauge: 21    Additional Needles:  Procedures: ultrasound guided Femoral nerve block Narrative:  Start time: 05/10/2012 8:02 AM End time: 05/10/2012 8:10 AM Injection made incrementally with aspirations every 5 mL.  Performed by: Personally  Anesthesiologist: C. Frederick MD  Additional Notes: Ultrasound guidance used to: id relevant anatomy, confirm needle position, local anesthetic spread, avoidance of vascular puncture. Picture saved. No complications. Block performed personally by Janetta Hora. Gelene Mink, MD    Femoral nerve block Performed by: Zenia Resides D    Procedure Name: LMA Insertion Date/Time: 05/10/2012 8:28 AM Performed by: Zenia Resides D Pre-anesthesia Checklist: Patient identified, Emergency Drugs available, Suction available, Patient being monitored and Timeout performed Patient Re-evaluated:Patient Re-evaluated prior to inductionOxygen Delivery Method: Circle System Utilized Preoxygenation: Pre-oxygenation with 100% oxygen Intubation Type: IV induction Ventilation: Mask ventilation without difficulty LMA: LMA inserted LMA Size: 4.0 Number of attempts: 1 Airway Equipment and Method: bite block Placement Confirmation: positive ETCO2 and breath sounds checked- equal and bilateral Tube secured with: Tape Dental Injury: Teeth and Oropharynx as per pre-operative assessment

## 2012-05-10 NOTE — H&P (Signed)
PREOPERATIVE H&P  Chief Complaint: right knee acl tear, chondromalacia  HPI: Catherine Zhang is a 41 y.o. female who presents for preoperative history and physical with a diagnosis of right possible knee acl tear, chondral defect. Symptoms are rated as moderate to severe, and have been worsening.  This is significantly impairing activities of daily living.  She has elected for surgical management.   Past Medical History  Diagnosis Date  . Asthma   . Osteoporosis   . PONV (postoperative nausea and vomiting)    Past Surgical History  Procedure Date  . Gastric bypass 2008    lost 150 lb  . Hernia repair 2008    ventral hernia  . Abdominal surgery     plasty  . Abdominal hysterectomy    History   Social History  . Marital Status: Divorced    Spouse Name: N/A    Number of Children: N/A  . Years of Education: N/A   Social History Main Topics  . Smoking status: Never Smoker   . Smokeless tobacco: None  . Alcohol Use: Yes     occ  . Drug Use: No  . Sexually Active:    Other Topics Concern  . None   Social History Narrative  . None   History reviewed. No pertinent family history. Allergies  Allergen Reactions  . Aspirin Nausea And Vomiting   Prior to Admission medications   Medication Sig Start Date End Date Taking? Authorizing Provider  baclofen (LIORESAL) 10 MG tablet Take 10 mg by mouth 3 (three) times daily.   Yes Historical Provider, MD  loratadine (CLARITIN) 10 MG tablet Take 10 mg by mouth daily.   Yes Historical Provider, MD  Multiple Vitamin (MULTIVITAMIN WITH MINERALS) TABS Take 1 tablet by mouth daily.   Yes Historical Provider, MD  naproxen (NAPROSYN) 375 MG tablet Take 1 tablet (375 mg total) by mouth 2 (two) times daily. 04/29/12 04/29/13 Yes Sunnie Nielsen, MD  topiramate (TOPAMAX) 100 MG tablet Take 100 mg by mouth daily.   Yes Historical Provider, MD  vitamin B-12 (CYANOCOBALAMIN) 100 MCG tablet Take 50 mcg by mouth daily.   Yes Historical Provider, MD    Vitamin D, Ergocalciferol, (DRISDOL) 50000 UNITS CAPS Take 50,000 Units by mouth. Mondays   Yes Historical Provider, MD  albuterol (PROVENTIL HFA;VENTOLIN HFA) 108 (90 BASE) MCG/ACT inhaler Inhale 2 puffs into the lungs every 6 (six) hours as needed. For wheeze or shortness of breath    Historical Provider, MD  oxyCODONE-acetaminophen (PERCOCET) 5-325 MG per tablet Take 2 tablets by mouth every 4 (four) hours as needed for pain. 04/29/12 05/09/12  Sunnie Nielsen, MD     Positive ROS: All other systems have been reviewed and were otherwise negative with the exception of those mentioned in the HPI and as above.  Physical Exam: General: Alert, no acute distress Cardiovascular: No pedal edema Respiratory: No cyanosis, no use of accessory musculature GI: No organomegaly, abdomen is soft and non-tender Skin: No lesions in the area of chief complaint Neurologic: Sensation intact distally Psychiatric: Patient is competent for consent with normal mood and affect Lymphatic: No axillary or cervical lymphadenopathy  MUSCULOSKELETAL: right knee is guarded, diffuse pain  Assessment: right knee possible ACL tear, chondral defect Plan: Plan for Procedure(s): KNEE ARTHROSCOPY WITH CHONDROPLASTY, POSSIBLE ANTERIOR CRUCIATE LIGAMENT (ACL) REPAIR WITH HAMSTRING GRAFT  The risks benefits and alternatives were discussed with the patient including but not limited to the risks of nonoperative treatment, versus surgical intervention including infection, bleeding, nerve  injury,  blood clots, cardiopulmonary complications, morbidity, mortality, among others, and they were willing to proceed.   Berkley Cronkright P, MD Cell [phone] Pager [phone]  05/10/2012 7:39 AM

## 2012-05-10 NOTE — Anesthesia Preprocedure Evaluation (Signed)
Anesthesia Evaluation  Patient identified by MRN, date of birth, ID band Patient awake    Reviewed: Allergy & Precautions, H&P , NPO status , Patient's Chart, lab work & pertinent test results, reviewed documented beta blocker date and time   History of Anesthesia Complications (+) PONV  Airway Mallampati: II TM Distance: >3 FB Neck ROM: full    Dental   Pulmonary asthma ,          Cardiovascular negative cardio ROS      Neuro/Psych negative neurological ROS  negative psych ROS   GI/Hepatic negative GI ROS, Neg liver ROS,   Endo/Other  negative endocrine ROS  Renal/GU negative Renal ROS  negative genitourinary   Musculoskeletal   Abdominal   Peds  Hematology negative hematology ROS (+)   Anesthesia Other Findings See surgeon's H&P   Reproductive/Obstetrics negative OB ROS                           Anesthesia Physical Anesthesia Plan  ASA: II  Anesthesia Plan: General   Post-op Pain Management:    Induction: Intravenous  Airway Management Planned: LMA  Additional Equipment:   Intra-op Plan:   Post-operative Plan: Extubation in OR  Informed Consent: I have reviewed the patients History and Physical, chart, labs and discussed the procedure including the risks, benefits and alternatives for the proposed anesthesia with the patient or authorized representative who has indicated his/her understanding and acceptance.   Dental Advisory Given  Plan Discussed with: CRNA and Surgeon  Anesthesia Plan Comments:         Anesthesia Quick Evaluation  

## 2012-05-10 NOTE — Op Note (Signed)
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  SURGEON:  Eulas Post, MD  PHYSICIAN ASSISTANT: Janace Litten, OPA-C, present and scrubbed throughout the case, critical for completion in a timely fashion, and for retraction, instrumentation, and closure.  ANESTHESIA:   General  PREOPERATIVE INDICATIONS:  Catherine Zhang is a  41 y.o. 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    The risks benefits and alternatives were discussed with the patient preoperatively including but not limited to the risks of infection, bleeding, nerve injury, cardiopulmonary complications, the need for revision surgery, among others, and the patient was willing to proceed.  OPERATIVE IMPLANTS: None  OPERATIVE FINDINGS: Right knee medial femoral chondral lesion, 0.75 x 0.75 cm, grade 4. With right knee lateral femoral condyle lesion, grade 3, 0.5 x 0.5 cm, and undersurface patellar chondromalacia, grade 2 to grade 3. She also had extensive grade 3 delamination of the tibial cartilage in a 2 x 2 centimeter distribution on the lateral tibial compartment. Her Lachman was intact. She had no instability to pivot shift testing. Her anterior cruciate ligament did have some degenerative changes, but upon good tension during intraoperative arthroscopy Lachman maneuver, as well as during exam under anesthesia. PCL was intact. The medial and lateral meniscus were normal. The dominant finding was diffuse degenerative cartilage in all 3 compartments, all of which were relatively small and early in progression,  not appropriate for arthroplasty at the current time, however certainly demonstrative of her symptoms. Unfortunately, these lesions do not have a good structural solution.  OPERATIVE PROCEDURE: The patient is brought to the operating room and placed in supine position. All bony prominences were padded. General anesthesia was administered. The leg was examined, and the Lachman was intact. The right lower extremity was prepped and draped in usual sterile fashion. Diagnostic arthroscopy was carried out the above-named findings. I used the arthroscopic shaver to debride the undersurface of the patella. I also debrided the lateral femoral condyle gently. The medial femoral condyle had a lesion that was anterior, more in line with where the patella tracked, that did not articulate with the tibia, but was grade 4. This is debrided with a shaver, and then an arthroscopic curet utilized, and then I performed a microfracture with 3 puncture holes. Excellent bony bleeding was achieved.  I completed the diagnostic arthroscopy and the menisci were all completely normal. I removed the arthroscopic instruments, drained the knee, and the portals were closed with Monocryl followed by Steri-Strips and sterile gauze. She was awakened and returned to the PACU in stable and satisfactory condition. There no complications.

## 2012-05-10 NOTE — Progress Notes (Signed)
Assisted Dr. Sampson Goon with right, ultrasound guided,saphenous block. Side rails up, monitors on throughout procedure. See vital signs in flow sheet. Tolerated Procedure well.

## 2012-05-10 NOTE — Anesthesia Postprocedure Evaluation (Signed)
Anesthesia Post Note  Patient: Catherine Zhang  Procedure(s) Performed: Procedure(s) (LRB): ARTHROSCOPY KNEE (Right)  Anesthesia type: MAC  Patient location: PACU  Post pain: Pain level controlled  Post assessment: Patient's Cardiovascular Status Stable  Last Vitals:  Filed Vitals:   05/10/12 1030  BP: 120/83  Pulse: 88  Temp:   Resp: 14    Post vital signs: Reviewed and stable  Level of consciousness: alert  Complications: No apparent anesthesia complications

## 2012-05-10 NOTE — Transfer of Care (Signed)
Immediate Anesthesia Transfer of Care Note  Patient: Catherine Zhang  Procedure(s) Performed: Procedure(s) (LRB): ARTHROSCOPY KNEE (Right)  Patient Location: PACU  Anesthesia Type: General and Regional  Level of Consciousness: awake, alert  and oriented  Airway & Oxygen Therapy: Patient Spontanous Breathing and Patient connected to face mask oxygen  Post-op Assessment: Report given to PACU RN and Post -op Vital signs reviewed and stable  Post vital signs: Reviewed and stable  Complications: No apparent anesthesia complications

## 2012-05-13 ENCOUNTER — Encounter (HOSPITAL_BASED_OUTPATIENT_CLINIC_OR_DEPARTMENT_OTHER): Payer: Self-pay | Admitting: Orthopedic Surgery

## 2012-05-16 ENCOUNTER — Encounter (HOSPITAL_BASED_OUTPATIENT_CLINIC_OR_DEPARTMENT_OTHER): Payer: Self-pay

## 2012-09-11 ENCOUNTER — Other Ambulatory Visit: Payer: Self-pay | Admitting: Family Medicine

## 2012-09-11 DIAGNOSIS — Z1231 Encounter for screening mammogram for malignant neoplasm of breast: Secondary | ICD-10-CM

## 2012-10-08 ENCOUNTER — Ambulatory Visit: Payer: Federal, State, Local not specified - PPO

## 2012-10-10 ENCOUNTER — Ambulatory Visit
Admission: RE | Admit: 2012-10-10 | Discharge: 2012-10-10 | Disposition: A | Discharge status: Home / Self Care | Payer: Federal, State, Local not specified - PPO | Source: Ambulatory Visit | Attending: Family Medicine | Admitting: Family Medicine

## 2012-10-10 DIAGNOSIS — Z1231 Encounter for screening mammogram for malignant neoplasm of breast: Secondary | ICD-10-CM

## 2012-11-14 ENCOUNTER — Other Ambulatory Visit: Payer: Self-pay | Admitting: Gastroenterology

## 2012-11-14 DIAGNOSIS — R1032 Left lower quadrant pain: Secondary | ICD-10-CM

## 2012-11-18 ENCOUNTER — Other Ambulatory Visit: Payer: Federal, State, Local not specified - PPO

## 2012-12-15 ENCOUNTER — Emergency Department (HOSPITAL_COMMUNITY): Payer: Federal, State, Local not specified - PPO

## 2012-12-15 ENCOUNTER — Encounter (HOSPITAL_COMMUNITY): Payer: Self-pay | Admitting: *Deleted

## 2012-12-15 ENCOUNTER — Emergency Department (HOSPITAL_COMMUNITY)
Admission: EM | Admit: 2012-12-15 | Discharge: 2012-12-15 | Disposition: A | Discharge status: Home / Self Care | Payer: Federal, State, Local not specified - PPO | Attending: Emergency Medicine | Admitting: Emergency Medicine

## 2012-12-15 DIAGNOSIS — R109 Unspecified abdominal pain: Secondary | ICD-10-CM

## 2012-12-15 DIAGNOSIS — Z9889 Other specified postprocedural states: Secondary | ICD-10-CM | POA: Insufficient documentation

## 2012-12-15 DIAGNOSIS — R197 Diarrhea, unspecified: Secondary | ICD-10-CM | POA: Insufficient documentation

## 2012-12-15 DIAGNOSIS — R1032 Left lower quadrant pain: Secondary | ICD-10-CM | POA: Insufficient documentation

## 2012-12-15 DIAGNOSIS — Z8739 Personal history of other diseases of the musculoskeletal system and connective tissue: Secondary | ICD-10-CM | POA: Insufficient documentation

## 2012-12-15 DIAGNOSIS — Z79899 Other long term (current) drug therapy: Secondary | ICD-10-CM | POA: Insufficient documentation

## 2012-12-15 DIAGNOSIS — J45909 Unspecified asthma, uncomplicated: Secondary | ICD-10-CM | POA: Insufficient documentation

## 2012-12-15 DIAGNOSIS — R112 Nausea with vomiting, unspecified: Secondary | ICD-10-CM | POA: Insufficient documentation

## 2012-12-15 DIAGNOSIS — Z90710 Acquired absence of both cervix and uterus: Secondary | ICD-10-CM | POA: Insufficient documentation

## 2012-12-15 DIAGNOSIS — Z87828 Personal history of other (healed) physical injury and trauma: Secondary | ICD-10-CM | POA: Insufficient documentation

## 2012-12-15 LAB — BASIC METABOLIC PANEL
BUN: 15 mg/dL (ref 6–23)
CO2: 22 mEq/L (ref 19–32)
Chloride: 108 mEq/L (ref 96–112)
Creatinine, Ser: 0.72 mg/dL (ref 0.50–1.10)
GFR calc Af Amer: >90 mL/min (ref >90)
Glucose, Bld: 88 mg/dL (ref 70–99)
Potassium: 3.3 mEq/L — ABNORMAL LOW (ref 3.5–5.1)

## 2012-12-15 LAB — URINALYSIS, ROUTINE W REFLEX MICROSCOPIC
Glucose, UA: NEGATIVE mg/dL
Nitrite: NEGATIVE
Specific Gravity, Urine: 1.031 — ABNORMAL HIGH (ref 1.005–1.030)
pH: 5.5 (ref 5.0–8.0)

## 2012-12-15 LAB — CBC WITH DIFFERENTIAL/PLATELET
Basophils Relative: 1 % (ref 0–1)
HCT: 40.9 % (ref 36.0–46.0)
Hemoglobin: 14.6 g/dL (ref 12.0–15.0)
Lymphocytes Relative: 47 % — ABNORMAL HIGH (ref 12–46)
Lymphs Abs: 1.9 10*3/uL (ref 0.7–4.0)
Monocytes Absolute: 0.2 10*3/uL (ref 0.1–1.0)
Monocytes Relative: 5 % (ref 3–12)
Neutro Abs: 1.8 10*3/uL (ref 1.7–7.7)
Neutrophils Relative %: 44 % (ref 43–77)
RBC: 4.45 MIL/uL (ref 3.87–5.11)
WBC: 4 10*3/uL (ref 4.0–10.5)

## 2012-12-15 MED ORDER — SODIUM CHLORIDE 0.9 % IV SOLN
INTRAVENOUS | Status: DC
  Administered 2012-12-15: 09:00:00 via INTRAVENOUS

## 2012-12-15 MED ORDER — ONDANSETRON HCL 4 MG/2ML IJ SOLN
4.0000 mg | Freq: Once | INTRAMUSCULAR | Status: AC
  Administered 2012-12-15: 4 mg via INTRAVENOUS
  Filled 2012-12-15: qty 2

## 2012-12-15 MED ORDER — IOHEXOL 300 MG/ML  SOLN
100.0000 mL | Freq: Once | INTRAMUSCULAR | Status: AC | PRN
  Administered 2012-12-15: 100 mL via INTRAVENOUS

## 2012-12-15 MED ORDER — IOHEXOL 300 MG/ML  SOLN
50.0000 mL | Freq: Once | INTRAMUSCULAR | Status: AC | PRN
  Administered 2012-12-15: 50 mL via ORAL

## 2012-12-15 MED ORDER — DICYCLOMINE HCL 20 MG PO TABS: 20.0000 mg | ORAL_TABLET | Freq: Four times a day (QID) | ORAL | Status: DC | PRN

## 2012-12-15 MED ORDER — MORPHINE SULFATE 4 MG/ML IJ SOLN
4.0000 mg | Freq: Once | INTRAMUSCULAR | Status: AC
  Administered 2012-12-15: 4 mg via INTRAVENOUS
  Filled 2012-12-15: qty 1

## 2012-12-15 MED ORDER — TRAMADOL HCL 50 MG PO TABS: 50.0000 mg | ORAL_TABLET | Freq: Four times a day (QID) | ORAL | Status: DC | PRN

## 2012-12-15 NOTE — ED Provider Notes (Signed)
History     CSN: [id number]  Arrival date & time 12/15/12  5784   First MD Initiated Contact with Patient 12/15/12 [phone]      Chief Complaint  Patient presents with  . Abdominal Pain    (Consider location/radiation/quality/duration/timing/severity/associated sxs/prior treatment) HPI Comments: Patient comes to the ER for evaluation of abdominal pain. Patient reports that symptoms began 5 days ago. She had 2 days of nausea, vomiting and diarrhea which resolved. She says that since then, however, she has had progressively worsening pain in the left side of her abdomen. Pain is severe. She has not had any urinary symptoms. Patient reports one previous episode similar to this. She says she was seen at urgent care and told she had diverticulitis. She never had any testing to confirm the diagnosis for followup. She was treated with pain medication and antibiotics, improved.  Patient is a 42 y.o. female presenting with abdominal pain.  Abdominal Pain Associated symptoms: diarrhea, nausea and vomiting   Associated symptoms: no fever     Past Medical History  Diagnosis Date  . Asthma   . Osteoporosis   . PONV (postoperative nausea and vomiting)   . Tear of articular cartilage of right knee as current injury 05/10/2012    Past Surgical History  Procedure Laterality Date  . Gastric bypass  2008    lost 150 lb  . Hernia repair  2008    ventral hernia  . Abdominal surgery      plasty  . Abdominal hysterectomy    . Knee arthroscopy  05/10/2012    Procedure: ARTHROSCOPY KNEE;  Surgeon: Eulas Post, MD;  Location: [facility];  Service: Orthopedics;  Laterality: Right;  Microfracture medial femoral condyle    History reviewed. No pertinent family history.  History  Substance Use Topics  . Smoking status: Never Smoker   . Smokeless tobacco: Not on file  . Alcohol Use: Yes     Comment: occ    OB History   Grav Para Term Preterm Abortions TAB SAB Ect Mult Living                Review of Systems  Constitutional: Negative for fever.  Gastrointestinal: Positive for nausea, vomiting, abdominal pain and diarrhea.  All other systems reviewed and are negative.    Allergies  Aspirin  Home Medications   Current Outpatient Rx  Name  Route  Sig  Dispense  Refill  . albuterol (PROVENTIL HFA;VENTOLIN HFA) 108 (90 BASE) MCG/ACT inhaler   Inhalation   Inhale 2 puffs into the lungs every 6 (six) hours as needed. For wheeze or shortness of breath         . baclofen (LIORESAL) 10 MG tablet   Oral   Take 10 mg by mouth 3 (three) times daily.         Marland Kitchen loratadine (CLARITIN) 10 MG tablet   Oral   Take 10 mg by mouth daily.         . Multiple Vitamin (MULTIVITAMIN WITH MINERALS) TABS   Oral   Take 1 tablet by mouth daily.         Marland Kitchen EXPIRED: promethazine (PHENERGAN) 25 MG tablet   Oral   Take 1 tablet (25 mg total) by mouth every 6 (six) hours as needed for nausea.   30 tablet   0   . topiramate (TOPAMAX) 100 MG tablet   Oral   Take 100 mg by mouth daily.         Marland Kitchen  vitamin B-12 (CYANOCOBALAMIN) 100 MCG tablet   Oral   Take 50 mcg by mouth daily.         . Vitamin D, Ergocalciferol, (DRISDOL) 50000 UNITS CAPS   Oral   Take 50,000 Units by mouth. Mondays           BP 101/67  Pulse 98  Temp(Src) 98.2 F (36.8 C) (Oral)  Resp 20  SpO2 100%  Physical Exam  Constitutional: She is oriented to person, place, and time. She appears well-developed and well-nourished. No distress.  HENT:  Head: Normocephalic and atraumatic.  Right Ear: Hearing normal.  Nose: Nose normal.  Mouth/Throat: Oropharynx is clear and moist and mucous membranes are normal.  Eyes: Conjunctivae and EOM are normal. Pupils are equal, round, and reactive to light.  Neck: Normal range of motion. Neck supple.  Cardiovascular: Normal rate, regular rhythm, S1 normal and S2 normal.  Exam reveals no gallop and no friction rub.   No murmur heard. Pulmonary/Chest:  Effort normal and breath sounds normal. No respiratory distress. She exhibits no tenderness.  Abdominal: Soft. Normal appearance and bowel sounds are normal. There is no hepatosplenomegaly. There is tenderness in the left lower quadrant. There is no rebound, no guarding, no tenderness at McBurney's point and negative Murphy's sign. No hernia.  Musculoskeletal: Normal range of motion.  Neurological: She is alert and oriented to person, place, and time. She has normal strength. No cranial nerve deficit or sensory deficit. Coordination normal. GCS eye subscore is 4. GCS verbal subscore is 5. GCS motor subscore is 6.  Skin: Skin is warm, dry and intact. No rash noted. No cyanosis.  Psychiatric: She has a normal mood and affect. Her speech is normal and behavior is normal. Thought content normal.    ED Course  Procedures (including critical care time)  Labs Reviewed  CBC WITH DIFFERENTIAL - Abnormal; Notable for the following:    Lymphocytes Relative 47 (*)    All other components within normal limits  BASIC METABOLIC PANEL - Abnormal; Notable for the following:    Potassium 3.3 (*)    All other components within normal limits  URINALYSIS, ROUTINE W REFLEX MICROSCOPIC - Abnormal; Notable for the following:    Color, Urine AMBER (*)    APPearance CLOUDY (*)    Specific Gravity, Urine 1.031 (*)    Bilirubin Urine SMALL (*)    All other components within normal limits  LACTIC ACID, PLASMA   Ct Abdomen Pelvis W Contrast  12/15/2012  *RADIOLOGY REPORT*  Clinical Data: Left lower quadrant abdominal pain.  CT ABDOMEN AND PELVIS WITH CONTRAST  Technique:  Multidetector CT imaging of the abdomen and pelvis was performed following the standard protocol during bolus administration of intravenous contrast.  Contrast: OMNIPAQUE IOHEXOL 300 MG/ML  SOLN  Comparison: None.  Findings: Lung bases are clear.  No effusions.  Heart is normal size.  Postsurgical changes in the left upper quadrant from prior  gastric bypass.  Prior cholecystectomy.  Small low-density area peripherally in the right hepatic lobe, likely small cyst.  Spleen, pancreas, adrenals and kidneys are normal.  Small bowel is decompressed.  Appendix is visualized and is normal. Large bowel grossly unremarkable.  No free fluid, free air or adenopathy.  Prior hysterectomy.  No adnexal masses.  Urinary bladder grossly unremarkable.  No acute bony abnormality.  IMPRESSION: No acute findings in the abdomen or pelvis.   Original Report Authenticated By: Charlett Nose, M.D.      Diagnosis: Abdominal pain  MDM  Patient comes to the ER with complaints of left-sided abdominal pain. She had nausea, vomiting and diarrhea initially, this resolved and she is simply having pain. She reports being told she had diverticulitis in the past, but never had any imaging studies. Blood work was normal today. CAT scan was performed and no abnormalities were seen. Patient reassured. She will be treated with analgesia and antispasmodics. She is a patient of [facility]. She should see her doctor and be referred to GI.        Gilda Crease, MD 12/15/12 1134

## 2012-12-15 NOTE — ED Notes (Signed)
Attempted to collect urine, pt could not void 

## 2012-12-15 NOTE — ED Notes (Signed)
Pt drinking oral ct contrast. 

## 2012-12-15 NOTE — ED Notes (Signed)
Patient transported to CT 

## 2012-12-15 NOTE — ED Notes (Signed)
Pt reports started with abd pain on Tuesday, had diarrhea till Thursday. Having severe constant abd pain that radiates around to her back. Reports severe pain with bowel movements, having nausea, denies vomiting, denies urinary symptoms.

## 2012-12-15 NOTE — ED Notes (Signed)
Pt attempted to obtain urine sample but was unable to urinate at this time

## 2012-12-20 ENCOUNTER — Encounter (HOSPITAL_COMMUNITY): Payer: Self-pay | Admitting: Cardiology

## 2012-12-20 ENCOUNTER — Emergency Department (HOSPITAL_COMMUNITY)
Admission: EM | Admit: 2012-12-20 | Discharge: 2012-12-20 | Disposition: A | Discharge status: Home / Self Care | Payer: Federal, State, Local not specified - PPO | Attending: Emergency Medicine | Admitting: Emergency Medicine

## 2012-12-20 ENCOUNTER — Emergency Department (HOSPITAL_COMMUNITY): Payer: Federal, State, Local not specified - PPO

## 2012-12-20 DIAGNOSIS — Z90710 Acquired absence of both cervix and uterus: Secondary | ICD-10-CM | POA: Insufficient documentation

## 2012-12-20 DIAGNOSIS — Z79899 Other long term (current) drug therapy: Secondary | ICD-10-CM | POA: Insufficient documentation

## 2012-12-20 DIAGNOSIS — Z8739 Personal history of other diseases of the musculoskeletal system and connective tissue: Secondary | ICD-10-CM | POA: Insufficient documentation

## 2012-12-20 DIAGNOSIS — Z3202 Encounter for pregnancy test, result negative: Secondary | ICD-10-CM | POA: Insufficient documentation

## 2012-12-20 DIAGNOSIS — Z9889 Other specified postprocedural states: Secondary | ICD-10-CM | POA: Insufficient documentation

## 2012-12-20 DIAGNOSIS — R1032 Left lower quadrant pain: Secondary | ICD-10-CM | POA: Insufficient documentation

## 2012-12-20 DIAGNOSIS — Z87828 Personal history of other (healed) physical injury and trauma: Secondary | ICD-10-CM | POA: Insufficient documentation

## 2012-12-20 DIAGNOSIS — Z9884 Bariatric surgery status: Secondary | ICD-10-CM | POA: Insufficient documentation

## 2012-12-20 DIAGNOSIS — J45909 Unspecified asthma, uncomplicated: Secondary | ICD-10-CM | POA: Insufficient documentation

## 2012-12-20 LAB — URINALYSIS, ROUTINE W REFLEX MICROSCOPIC
Glucose, UA: NEGATIVE mg/dL
Leukocytes, UA: NEGATIVE
Protein, ur: NEGATIVE mg/dL
Specific Gravity, Urine: 1.019 (ref 1.005–1.030)
Urobilinogen, UA: 2 mg/dL — ABNORMAL HIGH (ref 0.0–1.0)

## 2012-12-20 LAB — CBC
HCT: 38 % (ref 36.0–46.0)
MCV: 91.3 fL (ref 78.0–100.0)
Platelets: 205 10*3/uL (ref 150–400)
RBC: 4.16 MIL/uL (ref 3.87–5.11)
WBC: 3.9 10*3/uL — ABNORMAL LOW (ref 4.0–10.5)

## 2012-12-20 LAB — COMPREHENSIVE METABOLIC PANEL
AST: 16 U/L (ref 0–37)
Alkaline Phosphatase: 56 U/L (ref 39–117)
BUN: 7 mg/dL (ref 6–23)
CO2: 25 mEq/L (ref 19–32)
Chloride: 103 mEq/L (ref 96–112)
Creatinine, Ser: 0.61 mg/dL (ref 0.50–1.10)
GFR calc non Af Amer: >90 mL/min (ref >90)
Potassium: 3.5 mEq/L (ref 3.5–5.1)
Total Bilirubin: 1.4 mg/dL — ABNORMAL HIGH (ref 0.3–1.2)

## 2012-12-20 MED ORDER — HYDROCODONE-ACETAMINOPHEN 5-325 MG PO TABS: 1.0000 | ORAL_TABLET | ORAL | Status: DC | PRN

## 2012-12-20 MED ORDER — OXYCODONE-ACETAMINOPHEN 5-325 MG PO TABS
2.0000 | ORAL_TABLET | Freq: Once | ORAL | Status: AC
  Administered 2012-12-20: 2 via ORAL
  Filled 2012-12-20: qty 2

## 2012-12-20 MED ORDER — MORPHINE SULFATE 4 MG/ML IJ SOLN
4.0000 mg | Freq: Once | INTRAMUSCULAR | Status: AC
  Administered 2012-12-20: 4 mg via INTRAVENOUS
  Filled 2012-12-20: qty 1

## 2012-12-20 NOTE — ED Notes (Signed)
Done at [facility].

## 2012-12-20 NOTE — ED Notes (Signed)
C/o abd pain for 5 days she has chronic abd pain.  Alert skin warm and dry.  Iv placed

## 2012-12-20 NOTE — ED Notes (Signed)
Pt returned from xray

## 2012-12-20 NOTE — ED Notes (Signed)
Pt here from [facility] for further evaluation of llq pain for the past day. States she was seen here Sunday and had a CT scan done and cleared but pain returned. Reports nausea, and difficult urination.

## 2012-12-20 NOTE — ED Notes (Signed)
The pt reports that her pain has started again

## 2012-12-21 NOTE — ED Provider Notes (Signed)
History     CSN: [id number]  Arrival date & time 12/20/12  1626   First MD Initiated Contact with Patient 12/20/12 1723      Chief Complaint  Patient presents with  . Abdominal Pain    (Consider location/radiation/quality/duration/timing/severity/associated sxs/prior treatment) Patient is a 42 y.o. female presenting with abdominal pain. The history is provided by the patient. No language interpreter was used.  Abdominal Pain Pain location:  LLQ Pain quality: sharp   Pain radiates to:  Does not radiate Pain severity:  Severe Onset quality:  Gradual Duration:  2 days Timing:  Constant (recurrent) Progression:  Unchanged Chronicity:  Recurrent Context: previous surgery   Context: not alcohol use, not diet changes, not recent illness, not sick contacts and not suspicious food intake   Relieved by:  Nothing Worsened by:  Nothing tried Ineffective treatments:  NSAIDs, acetaminophen and lying down Associated symptoms: no anorexia, no chest pain, no chills, no constipation, no cough, no diarrhea, no dysuria, no fatigue, no fever, no hematemesis, no hematochezia, no hematuria, no nausea, no shortness of breath, no sore throat and no vomiting     Past Medical History  Diagnosis Date  . Asthma   . Osteoporosis   . PONV (postoperative nausea and vomiting)   . Tear of articular cartilage of right knee as current injury 05/10/2012    Past Surgical History  Procedure Laterality Date  . Gastric bypass  2008    lost 150 lb  . Hernia repair  2008    ventral hernia  . Abdominal surgery      plasty  . Abdominal hysterectomy    . Knee arthroscopy  05/10/2012    Procedure: ARTHROSCOPY KNEE;  Surgeon: Eulas Post, MD;  Location: [facility];  Service: Orthopedics;  Laterality: Right;  Microfracture medial femoral condyle    History reviewed. No pertinent family history.  History  Substance Use Topics  . Smoking status: Never Smoker   . Smokeless tobacco: Not on  file  . Alcohol Use: Yes     Comment: occ    OB History   Grav Para Term Preterm Abortions TAB SAB Ect Mult Living                  Review of Systems  Constitutional: Negative for fever, chills, diaphoresis, activity change, appetite change and fatigue.  HENT: Negative for congestion, sore throat, facial swelling, rhinorrhea, neck pain and neck stiffness.   Eyes: Negative for photophobia and discharge.  Respiratory: Negative for cough, chest tightness and shortness of breath.   Cardiovascular: Negative for chest pain, palpitations and leg swelling.  Gastrointestinal: Positive for abdominal pain. Negative for nausea, vomiting, diarrhea, constipation, hematochezia, anorexia and hematemesis.  Endocrine: Negative for polydipsia and polyuria.  Genitourinary: Negative for dysuria, frequency, hematuria, difficulty urinating and pelvic pain.  Musculoskeletal: Negative for back pain and arthralgias.  Skin: Negative for color change and wound.  Allergic/Immunologic: Negative for immunocompromised state.  Neurological: Negative for facial asymmetry, weakness, numbness and headaches.  Hematological: Does not bruise/bleed easily.  Psychiatric/Behavioral: Negative for confusion and agitation.    Allergies  Aspirin  Home Medications   Current Outpatient Rx  Name  Route  Sig  Dispense  Refill  . albuterol (PROVENTIL HFA;VENTOLIN HFA) 108 (90 BASE) MCG/ACT inhaler   Inhalation   Inhale 2 puffs into the lungs every 6 (six) hours as needed. For wheeze or shortness of breath         . baclofen (LIORESAL) 10  MG tablet   Oral   Take 10 mg by mouth 3 (three) times daily.         Marland Kitchen dicyclomine (BENTYL) 20 MG tablet   Oral   Take 1 tablet (20 mg total) by mouth every 6 (six) hours as needed (abdominal pain).   20 tablet   0   . Digestive Enzymes (PAPAYA AND ENZYMES) CHEW   Oral   Chew 8 tablets by mouth daily.         Marland Kitchen ibuprofen (ADVIL,MOTRIN) 200 MG tablet   Oral   Take 200 mg  by mouth every 6 (six) hours as needed for pain.         Marland Kitchen loratadine (CLARITIN) 10 MG tablet   Oral   Take 10 mg by mouth daily.         . Multiple Vitamin (MULTIVITAMIN WITH MINERALS) TABS   Oral   Take 1 tablet by mouth daily.         Marland Kitchen topiramate (TOPAMAX) 100 MG tablet   Oral   Take 100 mg by mouth daily.         . vitamin B-12 (CYANOCOBALAMIN) 100 MCG tablet   Oral   Take 100 mcg by mouth daily.          . Vitamin D, Ergocalciferol, (DRISDOL) 50000 UNITS CAPS   Oral   Take 50,000 Units by mouth. Mondays         . HYDROcodone-acetaminophen (NORCO) 5-325 MG per tablet   Oral   Take 1 tablet by mouth every 4 (four) hours as needed for pain.   10 tablet   0     BP 111/71  Pulse 76  Temp(Src) 97.5 F (36.4 C) (Oral)  Resp 18  SpO2 98%  Physical Exam  Constitutional: She is oriented to person, place, and time. She appears well-developed and well-nourished. No distress.  HENT:  Head: Normocephalic and atraumatic.  Mouth/Throat: No oropharyngeal exudate.  Eyes: Pupils are equal, round, and reactive to light.  Neck: Normal range of motion. Neck supple.  Cardiovascular: Normal rate, regular rhythm and normal heart sounds.  Exam reveals no gallop and no friction rub.   No murmur heard. Pulmonary/Chest: Effort normal and breath sounds normal. No respiratory distress. She has no wheezes. She has no rales.  Abdominal: Soft. Bowel sounds are normal. She exhibits no distension and no mass. There is tenderness in the left lower quadrant. There is no rebound and no guarding.  Musculoskeletal: Normal range of motion. She exhibits no edema and no tenderness.  Neurological: She is alert and oriented to person, place, and time.  Skin: Skin is warm and dry.  Psychiatric: She has a normal mood and affect.    ED Course  Procedures (including critical care time)  Labs Reviewed  URINALYSIS, ROUTINE W REFLEX MICROSCOPIC - Abnormal; Notable for the following:    Color,  Urine AMBER (*)    Bilirubin Urine SMALL (*)    Ketones, ur 15 (*)    Urobilinogen, UA 2.0 (*)    All other components within normal limits  CBC - Abnormal; Notable for the following:    WBC 3.9 (*)    All other components within normal limits  COMPREHENSIVE METABOLIC PANEL - Abnormal; Notable for the following:    Albumin 3.4 (*)    Total Bilirubin 1.4 (*)    All other components within normal limits  POCT PREGNANCY, URINE   US Transvaginal Non-ob  12/20/2012  *RADIOLOGY REPORT*  Clinical  Data:  Recurrent episodes of left lower quadrant abdomen/pelvic pain.  Previous partial hysterectomy.  TRANSABDOMINAL AND TRANSVAGINAL ULTRASOUND OF PELVIS DOPPLER ULTRASOUND OF OVARIES  Technique:  Both transabdominal and transvaginal ultrasound examinations of the pelvis were performed. Transabdominal technique was performed for global imaging of the pelvis including uterus, ovaries, adnexal regions, and pelvic cul-de-sac.  It was necessary to proceed with endovaginal exam following the transabdominal exam to visualize the right ovary and to visualize the left ovary and cervix in better detail.  Color and duplex Doppler ultrasound was utilized to evaluate blood flow to the ovaries.  Comparison:  None.  Findings:  Uterus:  Surgically absent.  The cervix remains in place with Nabothian cysts noted.  Endometrium:  Not applicable  Right ovary: Normal, containing follicles.  Left ovary:    Normal, containing follicles.  Pulsed Doppler evaluation demonstrates normal low-resistance arterial and venous waveforms in both ovaries.  No free peritoneal fluid.  IMPRESSION: Status post hysterectomy with the cervix remaining in place. Normal appearing ovaries.  No sonographic evidence for ovarian torsion.   Original Report Authenticated By: Beckie Salts, M.D.    US Pelvis Complete  12/20/2012  *RADIOLOGY REPORT*  Clinical Data:  Recurrent episodes of left lower quadrant abdomen/pelvic pain.  Previous partial hysterectomy.   TRANSABDOMINAL AND TRANSVAGINAL ULTRASOUND OF PELVIS DOPPLER ULTRASOUND OF OVARIES  Technique:  Both transabdominal and transvaginal ultrasound examinations of the pelvis were performed. Transabdominal technique was performed for global imaging of the pelvis including uterus, ovaries, adnexal regions, and pelvic cul-de-sac.  It was necessary to proceed with endovaginal exam following the transabdominal exam to visualize the right ovary and to visualize the left ovary and cervix in better detail.  Color and duplex Doppler ultrasound was utilized to evaluate blood flow to the ovaries.  Comparison:  None.  Findings:  Uterus:  Surgically absent.  The cervix remains in place with Nabothian cysts noted.  Endometrium:  Not applicable  Right ovary: Normal, containing follicles.  Left ovary:    Normal, containing follicles.  Pulsed Doppler evaluation demonstrates normal low-resistance arterial and venous waveforms in both ovaries.  No free peritoneal fluid.  IMPRESSION: Status post hysterectomy with the cervix remaining in place. Normal appearing ovaries.  No sonographic evidence for ovarian torsion.   Original Report Authenticated By: Beckie Salts, M.D.    [place] Art/ven Flow Abd Pelv Doppler  12/20/2012  *RADIOLOGY REPORT*  Clinical Data:  Recurrent episodes of left lower quadrant abdomen/pelvic pain.  Previous partial hysterectomy.  TRANSABDOMINAL AND TRANSVAGINAL ULTRASOUND OF PELVIS DOPPLER ULTRASOUND OF OVARIES  Technique:  Both transabdominal and transvaginal ultrasound examinations of the pelvis were performed. Transabdominal technique was performed for global imaging of the pelvis including uterus, ovaries, adnexal regions, and pelvic cul-de-sac.  It was necessary to proceed with endovaginal exam following the transabdominal exam to visualize the right ovary and to visualize the left ovary and cervix in better detail.  Color and duplex Doppler ultrasound was utilized to evaluate blood flow to the ovaries.  Comparison:   None.  Findings:  Uterus:  Surgically absent.  The cervix remains in place with Nabothian cysts noted.  Endometrium:  Not applicable  Right ovary: Normal, containing follicles.  Left ovary:    Normal, containing follicles.  Pulsed Doppler evaluation demonstrates normal low-resistance arterial and venous waveforms in both ovaries.  No free peritoneal fluid.  IMPRESSION: Status post hysterectomy with the cervix remaining in place. Normal appearing ovaries.  No sonographic evidence for ovarian torsion.   Original Report Authenticated  By: Beckie Salts, M.D.      1. Abdominal pain of unknown etiology       MDM   Pt is a 42 y.o. female with pertinent PMHX of asthma who presents with 4th episode of LLQ pain.  Pt seen jan '14, dx/tx for diverticulitis w/o imaging.  Pain resolved, but then returned 5 day ago and had nml CT ab pelvis at that time.  Pain again resolved but returned again yesterday.  No assoc n/v, d/a, dysuria, hematuria, fever, vaginal discharge.  No aggravating or alleviating symptoms.  Pt focally tender to LLQ on exam.  Given pt has had recent CT ab/pelvis, doubt appendicitis, SBO, diverticulitis.  US pelvis ordered to r/o ovarian pathology.  Will treat pain.  CBC, CMP, UA unremarkable for cause of symptoms.   US pelvis unremarkable for cause of symptoms. Pt feeling somewhat improved after PO percocet.  I feel pt safe to be d/c home and is unlikely to have acute surgical emergency.  She can f/u with PCP and GI.  Return precautions given for new or worsening symptoms.   1. Abdominal pain of unknown etiology      Labs and imaging considered in decision making, reviewed by myself.  Imaging interpreted by radiology. Pt care discussed with my attending, Dr. Ethelda Chick.    Toy Cookey, MD 12/21/12 [phone]

## 2012-12-21 NOTE — ED Provider Notes (Signed)
.    I have discussed the plan of care with the resident.  I have reviewed the documentation on PMH/FH/Soc. History.  I have reviewed the documentation of the resident and agree.  Doug Sou, MD 12/21/12 [phone]

## 2013-01-16 ENCOUNTER — Other Ambulatory Visit (HOSPITAL_COMMUNITY)
Admission: RE | Admit: 2013-01-16 | Discharge: 2013-01-16 | Disposition: A | Discharge status: Home / Self Care | Payer: Federal, State, Local not specified - PPO | Source: Ambulatory Visit | Attending: Obstetrics and Gynecology | Admitting: Obstetrics and Gynecology

## 2013-01-16 ENCOUNTER — Other Ambulatory Visit: Payer: Self-pay | Admitting: Nurse Practitioner

## 2013-01-16 DIAGNOSIS — N760 Acute vaginitis: Secondary | ICD-10-CM | POA: Insufficient documentation

## 2013-11-27 ENCOUNTER — Other Ambulatory Visit: Payer: Self-pay

## 2013-11-27 DIAGNOSIS — Z1231 Encounter for screening mammogram for malignant neoplasm of breast: Secondary | ICD-10-CM

## 2013-12-22 ENCOUNTER — Ambulatory Visit: Payer: Federal, State, Local not specified - PPO

## 2013-12-30 ENCOUNTER — Ambulatory Visit: Payer: Federal, State, Local not specified - PPO

## 2014-02-11 IMAGING — CR DG HAND COMPLETE 3+V*R*
3 series · 3 of 3 positions shown · non-contrast
Comparison: None

CLINICAL DATA: Right hand pain

RIGHT HAND - COMPLETE 3+ VIEW

[x hand pa right]
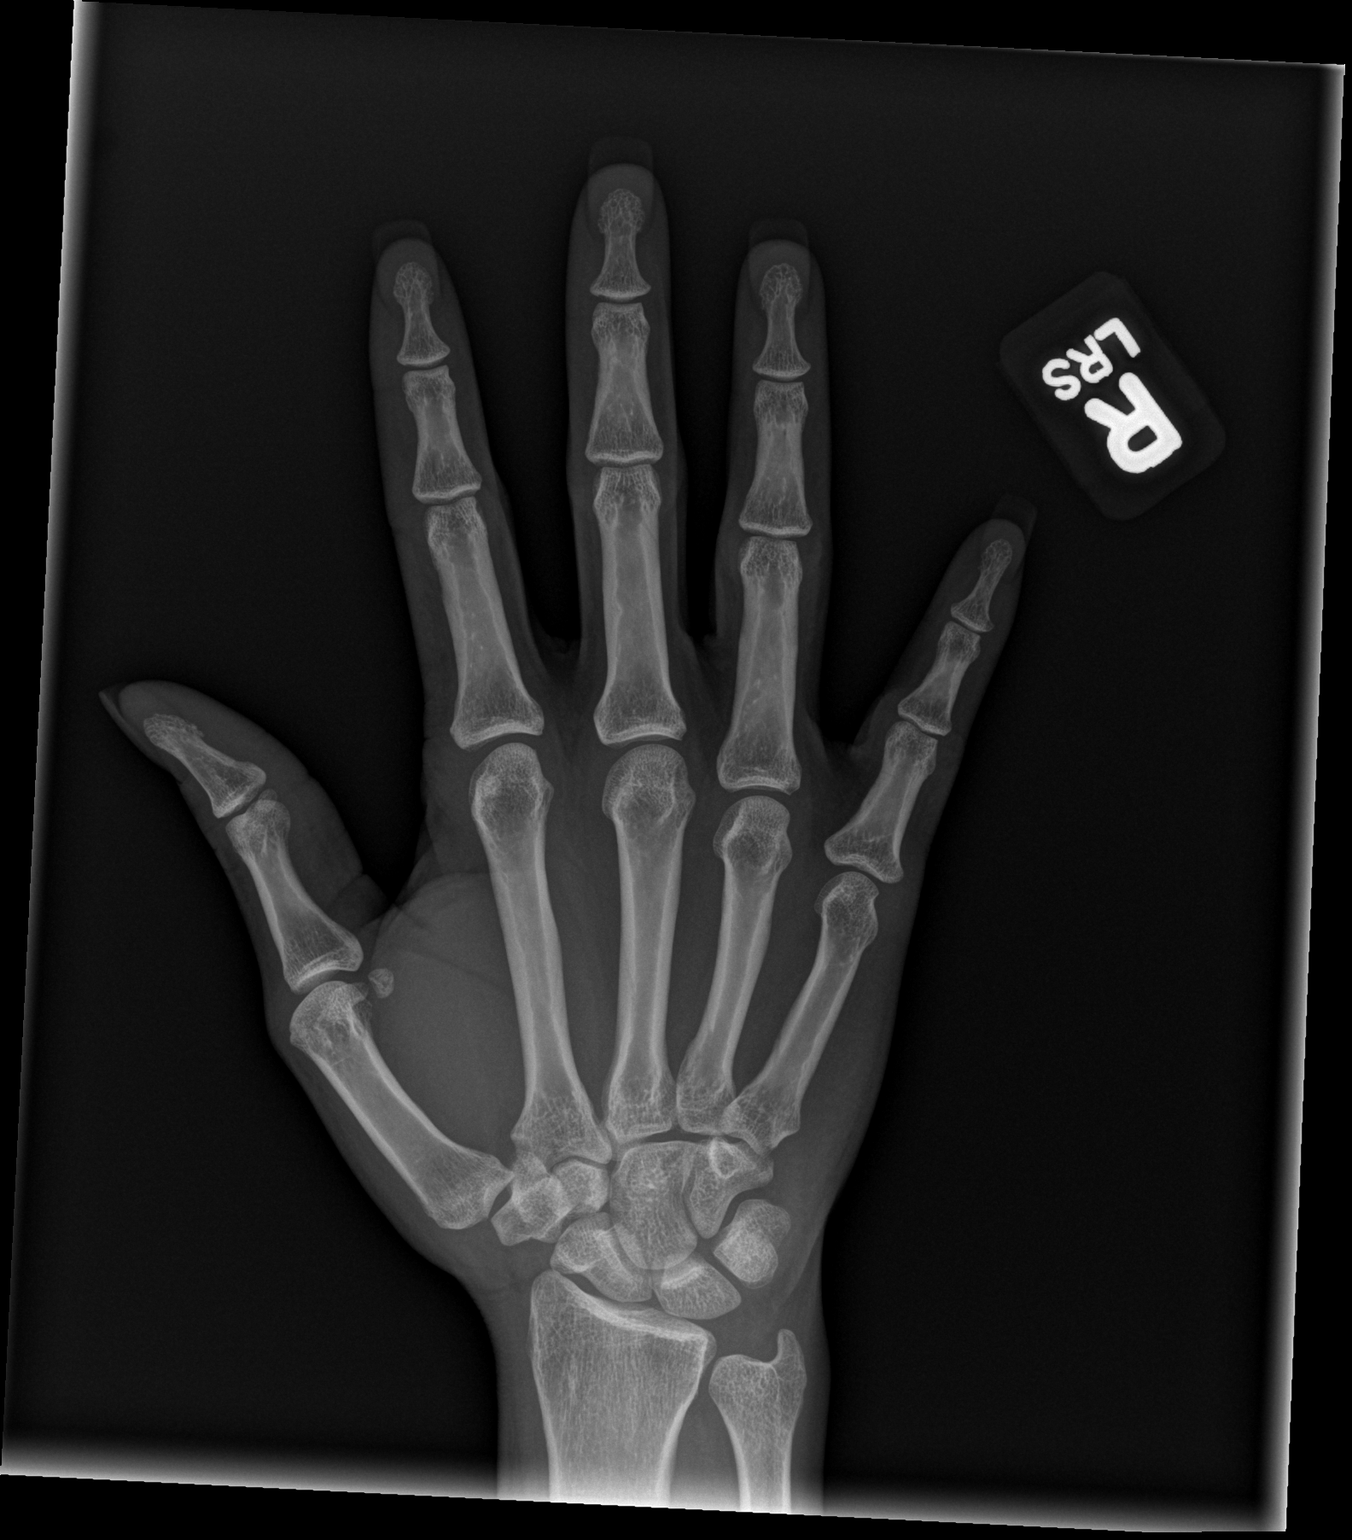

[x hand obl right]
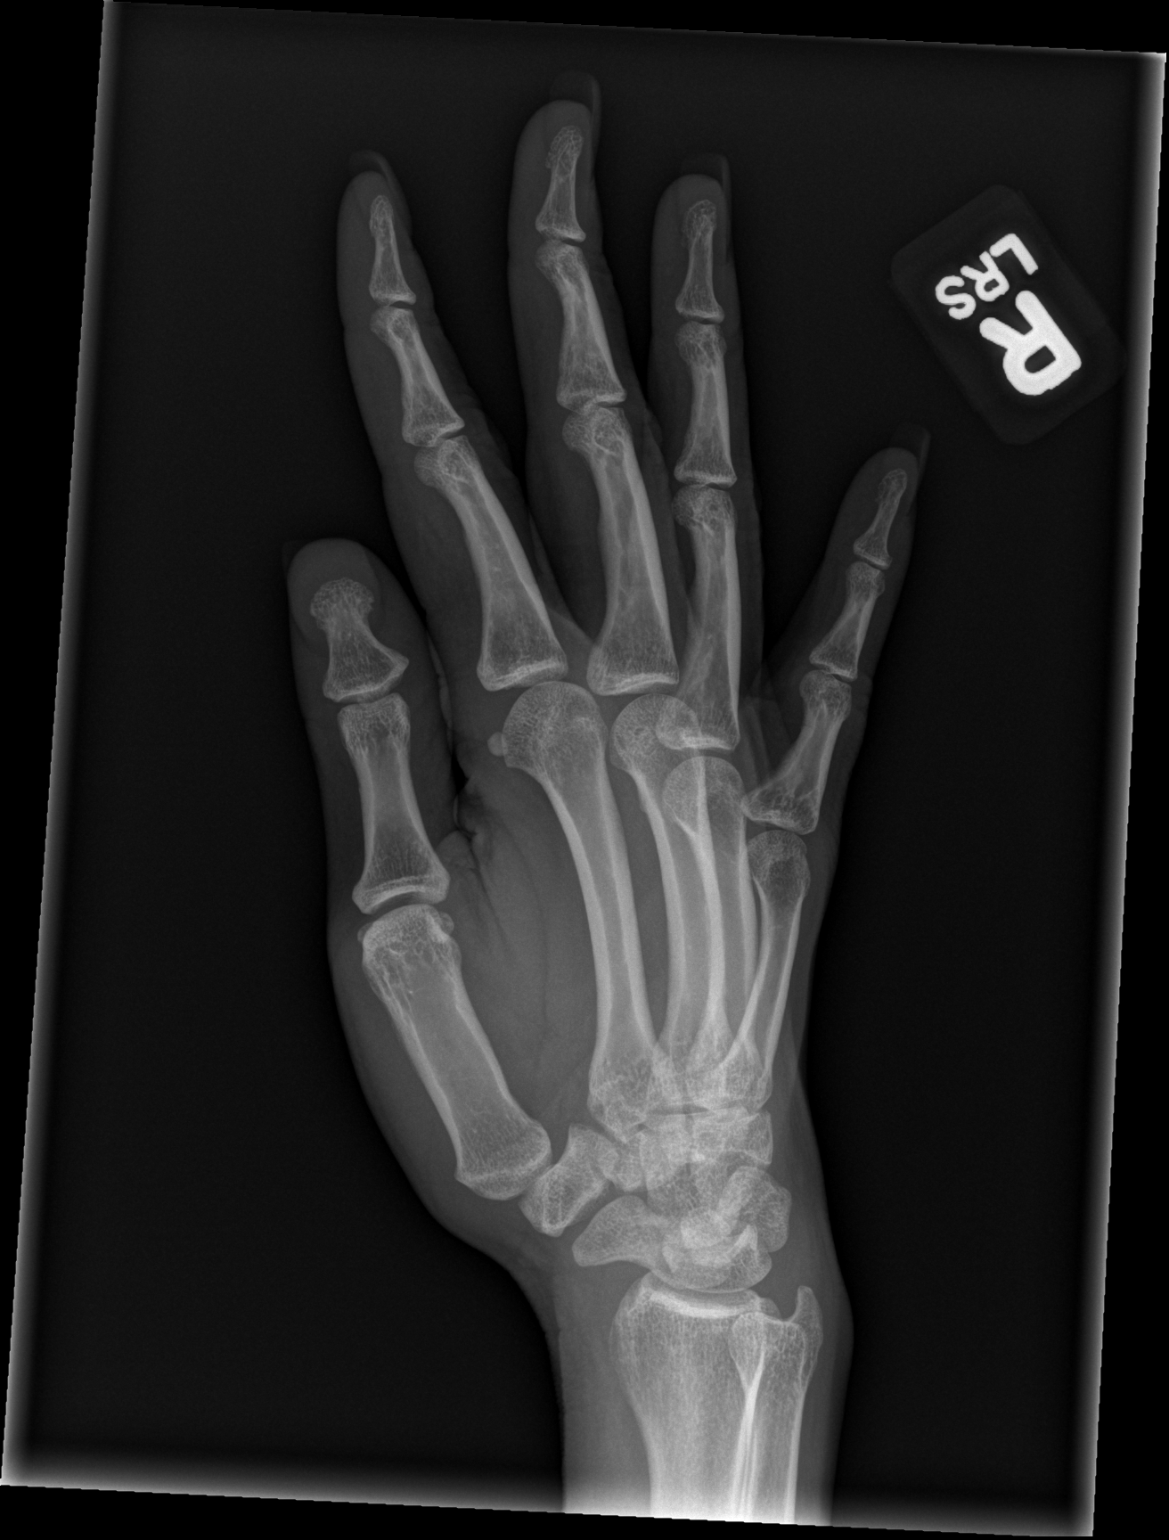

[x hand lat right]
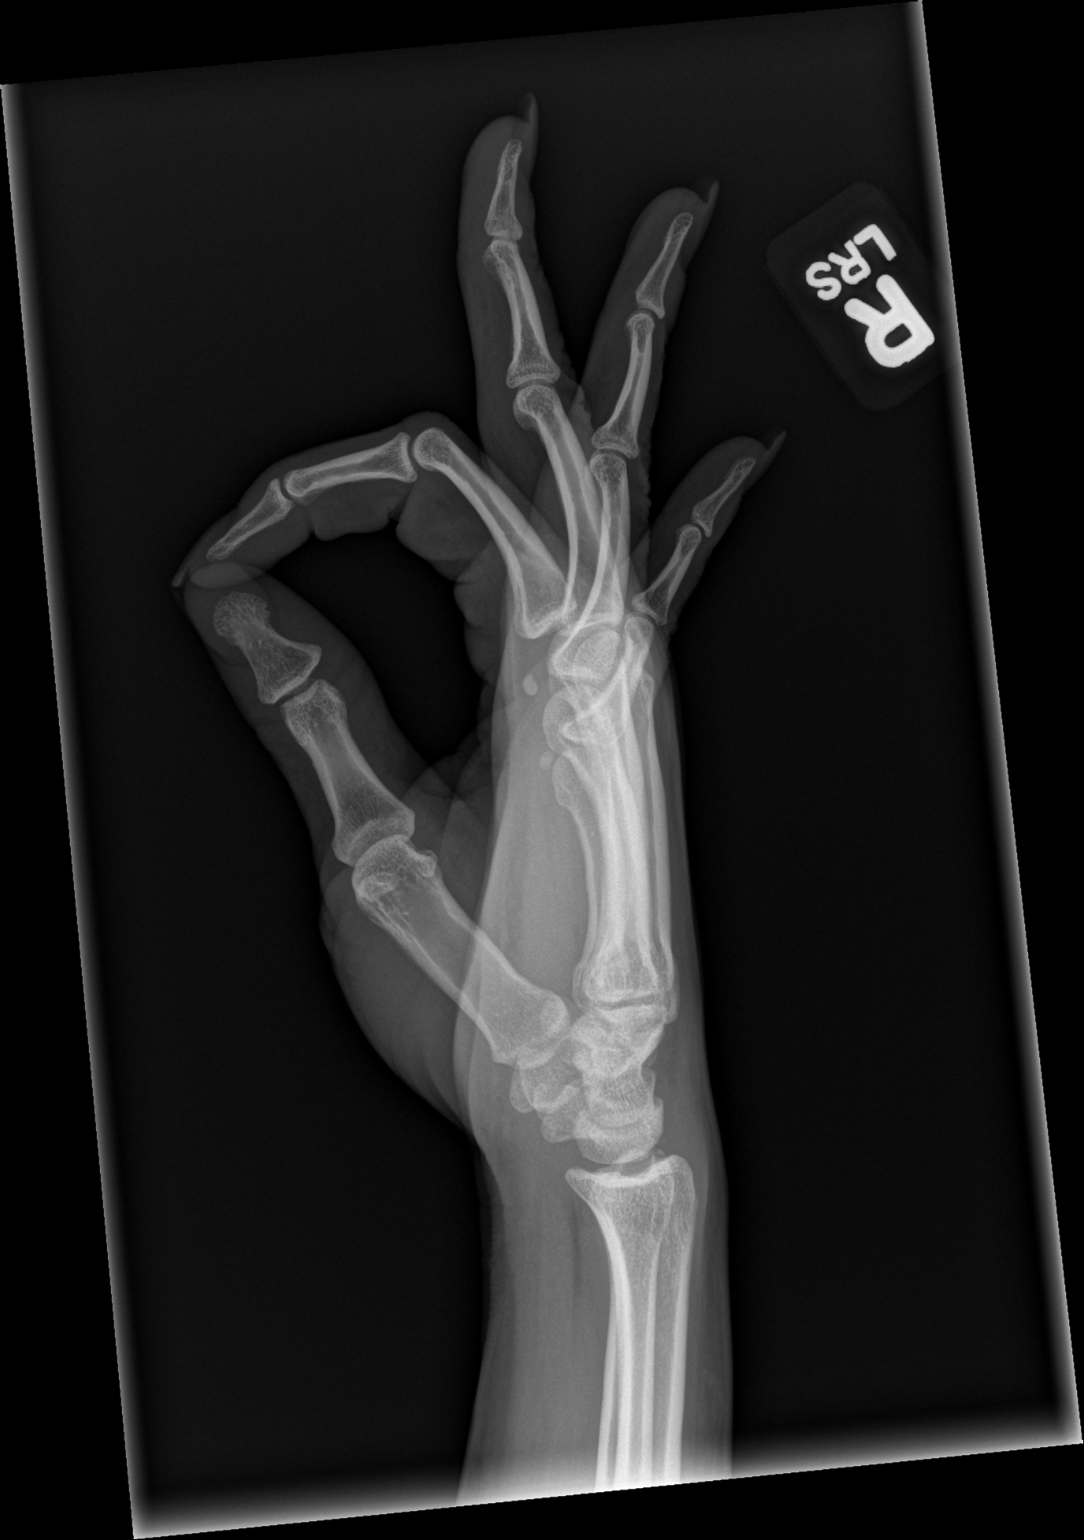

[3 of 3 positions shown; findings below may reference images not displayed]

FINDINGS: No displaced fracture.  No dislocation.  No aggressive
osseous lesions.
IMPRESSION: No acute osseous abnormality identified. If clinical concern for a
fracture persists, recommend a repeat radiograph in 5-10 days to
evaluate for interval change or callus formation.

## 2014-04-10 ENCOUNTER — Encounter (HOSPITAL_COMMUNITY): Payer: Self-pay | Admitting: Emergency Medicine

## 2014-04-10 ENCOUNTER — Emergency Department (HOSPITAL_COMMUNITY)
Admission: EM | Admit: 2014-04-10 | Discharge: 2014-04-10 | Disposition: A | Discharge status: Home / Self Care | Payer: Federal, State, Local not specified - PPO | Attending: Emergency Medicine | Admitting: Emergency Medicine

## 2014-04-10 ENCOUNTER — Emergency Department (HOSPITAL_COMMUNITY): Payer: Federal, State, Local not specified - PPO

## 2014-04-10 DIAGNOSIS — R509 Fever, unspecified: Secondary | ICD-10-CM | POA: Insufficient documentation

## 2014-04-10 DIAGNOSIS — R319 Hematuria, unspecified: Secondary | ICD-10-CM | POA: Insufficient documentation

## 2014-04-10 DIAGNOSIS — IMO0001 Reserved for inherently not codable concepts without codable children: Secondary | ICD-10-CM | POA: Insufficient documentation

## 2014-04-10 DIAGNOSIS — Z90710 Acquired absence of both cervix and uterus: Secondary | ICD-10-CM | POA: Insufficient documentation

## 2014-04-10 DIAGNOSIS — R112 Nausea with vomiting, unspecified: Secondary | ICD-10-CM

## 2014-04-10 DIAGNOSIS — E876 Hypokalemia: Secondary | ICD-10-CM | POA: Insufficient documentation

## 2014-04-10 DIAGNOSIS — R61 Generalized hyperhidrosis: Secondary | ICD-10-CM | POA: Insufficient documentation

## 2014-04-10 DIAGNOSIS — R1084 Generalized abdominal pain: Secondary | ICD-10-CM | POA: Insufficient documentation

## 2014-04-10 DIAGNOSIS — R630 Anorexia: Secondary | ICD-10-CM | POA: Insufficient documentation

## 2014-04-10 DIAGNOSIS — K5900 Constipation, unspecified: Secondary | ICD-10-CM | POA: Insufficient documentation

## 2014-04-10 DIAGNOSIS — Z79899 Other long term (current) drug therapy: Secondary | ICD-10-CM | POA: Insufficient documentation

## 2014-04-10 DIAGNOSIS — R109 Unspecified abdominal pain: Secondary | ICD-10-CM

## 2014-04-10 DIAGNOSIS — M810 Age-related osteoporosis without current pathological fracture: Secondary | ICD-10-CM | POA: Insufficient documentation

## 2014-04-10 DIAGNOSIS — J45909 Unspecified asthma, uncomplicated: Secondary | ICD-10-CM | POA: Insufficient documentation

## 2014-04-10 DIAGNOSIS — M549 Dorsalgia, unspecified: Secondary | ICD-10-CM | POA: Insufficient documentation

## 2014-04-10 DIAGNOSIS — Z87828 Personal history of other (healed) physical injury and trauma: Secondary | ICD-10-CM | POA: Insufficient documentation

## 2014-04-10 LAB — CBC WITH DIFFERENTIAL/PLATELET
BASOS PCT: 1 % (ref 0–1)
Basophils Absolute: 0 10*3/uL (ref 0.0–0.1)
EOS PCT: 5 % (ref 0–5)
Eosinophils Absolute: 0.2 10*3/uL (ref 0.0–0.7)
HCT: 43.2 % (ref 36.0–46.0)
HEMOGLOBIN: 15.1 g/dL — AB (ref 12.0–15.0)
LYMPHS PCT: 64 % — AB (ref 12–46)
Lymphs Abs: 2.5 10*3/uL (ref 0.7–4.0)
MCH: 33.9 pg (ref 26.0–34.0)
MCHC: 35 g/dL (ref 30.0–36.0)
MCV: 96.9 fL (ref 78.0–100.0)
Monocytes Absolute: 0.2 10*3/uL (ref 0.1–1.0)
Monocytes Relative: 6 % (ref 3–12)
NEUTROS PCT: 24 % — AB (ref 43–77)
Neutro Abs: 0.9 10*3/uL — ABNORMAL LOW (ref 1.7–7.7)
Platelets: 227 10*3/uL (ref 150–400)
RBC: 4.46 MIL/uL (ref 3.87–5.11)
RDW: 13.2 % (ref 11.5–15.5)
WBC: 3.8 10*3/uL — ABNORMAL LOW (ref 4.0–10.5)

## 2014-04-10 LAB — URINALYSIS, ROUTINE W REFLEX MICROSCOPIC
BILIRUBIN URINE: NEGATIVE
Glucose, UA: NEGATIVE mg/dL
Hgb urine dipstick: NEGATIVE
KETONES UR: NEGATIVE mg/dL
Leukocytes, UA: NEGATIVE
NITRITE: NEGATIVE
Protein, ur: NEGATIVE mg/dL
Specific Gravity, Urine: 1.006 (ref 1.005–1.030)
UROBILINOGEN UA: 1 mg/dL (ref 0.0–1.0)
pH: 7.5 (ref 5.0–8.0)

## 2014-04-10 LAB — COMPREHENSIVE METABOLIC PANEL
ALK PHOS: 48 U/L (ref 39–117)
ALT: 6 U/L (ref 0–35)
AST: 17 U/L (ref 0–37)
Albumin: 3.6 g/dL (ref 3.5–5.2)
BILIRUBIN TOTAL: 1.5 mg/dL — AB (ref 0.3–1.2)
BUN: 4 mg/dL — ABNORMAL LOW (ref 6–23)
CO2: 25 meq/L (ref 19–32)
Calcium: 8.8 mg/dL (ref 8.4–10.5)
Chloride: 105 mEq/L (ref 96–112)
Creatinine, Ser: 0.67 mg/dL (ref 0.50–1.10)
GLUCOSE: 84 mg/dL (ref 70–99)
POTASSIUM: 3.2 meq/L — AB (ref 3.7–5.3)
SODIUM: 141 meq/L (ref 137–147)
Total Protein: 6.9 g/dL (ref 6.0–8.3)

## 2014-04-10 LAB — LIPASE, BLOOD: Lipase: 18 U/L (ref 11–59)

## 2014-04-10 LAB — PATHOLOGIST SMEAR REVIEW

## 2014-04-10 MED ORDER — IOHEXOL 300 MG/ML  SOLN
50.0000 mL | Freq: Once | INTRAMUSCULAR | Status: AC | PRN
  Administered 2014-04-10: 50 mL via ORAL

## 2014-04-10 MED ORDER — PROMETHAZINE HCL 25 MG PO TABS: 25.0000 mg | ORAL_TABLET | Freq: Four times a day (QID) | ORAL | Status: AC | PRN

## 2014-04-10 MED ORDER — HYDROMORPHONE HCL PF 1 MG/ML IJ SOLN
1.0000 mg | Freq: Once | INTRAMUSCULAR | Status: AC
  Administered 2014-04-10: 1 mg via INTRAVENOUS
  Filled 2014-04-10: qty 1

## 2014-04-10 MED ORDER — IOHEXOL 300 MG/ML  SOLN
100.0000 mL | Freq: Once | INTRAMUSCULAR | Status: AC | PRN
  Administered 2014-04-10: 100 mL via INTRAVENOUS

## 2014-04-10 MED ORDER — ONDANSETRON HCL 4 MG/2ML IJ SOLN
4.0000 mg | Freq: Once | INTRAMUSCULAR | Status: AC
  Administered 2014-04-10: 4 mg via INTRAVENOUS
  Filled 2014-04-10: qty 2

## 2014-04-10 MED ORDER — SODIUM CHLORIDE 0.9 % IV BOLUS (SEPSIS)
1000.0000 mL | INTRAVENOUS | Status: AC
  Administered 2014-04-10: 1000 mL via INTRAVENOUS

## 2014-04-10 MED ORDER — POTASSIUM CHLORIDE 10 MEQ/100ML IV SOLN
10.0000 meq | Freq: Once | INTRAVENOUS | Status: AC
Start: 2014-04-10 — End: 2014-04-10
  Administered 2014-04-10: 10 meq via INTRAVENOUS
  Filled 2014-04-10: qty 100

## 2014-04-10 NOTE — ED Notes (Signed)
Patient up to bathroom to urinate.  Drinking contrast.

## 2014-04-10 NOTE — ED Provider Notes (Signed)
CSN: [id number]     Arrival date & time 04/10/14  0741 History   First MD Initiated Contact with Patient 04/10/14 0751     Chief Complaint  Patient presents with  . Abdominal Pain     (Consider location/radiation/quality/duration/timing/severity/associated sxs/prior Treatment) HPI Pt is a 43yo female with hx of gastric bypass surgery in 2008 with ventral hernia repair, hysterectomy, and cholecystectomy presenting to ED c/o abdominal pain associated with nausea, vomiting, hot and cold chills that have progressively worsened since Wednesday, 6/10.  Pt states she saw her GI specialist, Dr. Bosie Clos who scheduled pt for "abdominal x-ray" on Wednesday, 6/10 but pt was in too much pain to leave her house.  Pt also reports she is scheduled for endoscopy and colonoscopy July 1st.  Pain is diffuse, constant, sharp, aching, 10/10, radiating to her back.  Percocet does help pain but states she has had trouble with constipation so she has tried to cut back.  She has also tried left over PCN as she looked at [employer] and thought she might have a kidney infection. Denies relief, states symptoms have just worsened.  Last BM 2 days ago which is normal for her.  Reports dark urine but denies dysuria or vaginal symptoms.  Reports having pelvic exam by OB/GYN on Tuesday, 6/9. States everything was normal.   Gastric Bypass- was performed in [place] in 2008.   Past Medical History  Diagnosis Date  . Asthma   . Osteoporosis   . PONV (postoperative nausea and vomiting)   . Tear of articular cartilage of right knee as current injury 05/10/2012   Past Surgical History  Procedure Laterality Date  . Gastric bypass  2008    lost 150 lb  . Hernia repair  2008    ventral hernia  . Abdominal surgery      plasty  . Abdominal hysterectomy    . Knee arthroscopy  05/10/2012    Procedure: ARTHROSCOPY KNEE;  Surgeon: Eulas Post, MD;  Location: [facility];  Service: Orthopedics;  Laterality: Right;   Microfracture medial femoral condyle   No family history on file. History  Substance Use Topics  . Smoking status: Never Smoker   . Smokeless tobacco: Not on file  . Alcohol Use: Yes     Comment: occ   OB History   Grav Para Term Preterm Abortions TAB SAB Ect Mult Living                 Review of Systems  Constitutional: Positive for fever ( subjective), chills, diaphoresis and appetite change. Negative for activity change, fatigue and unexpected weight change.  Cardiovascular: Negative for chest pain and palpitations.  Gastrointestinal: Positive for nausea, vomiting, abdominal pain and constipation. Negative for diarrhea.  Genitourinary: Positive for hematuria ( "dark"). Negative for dysuria, frequency, flank pain, decreased urine volume, vaginal bleeding, vaginal discharge, vaginal pain and pelvic pain.  Musculoskeletal: Positive for back pain and myalgias.  All other systems reviewed and are negative.     Allergies  Aspirin and Tomato  Home Medications   Prior to Admission medications   Medication Sig Start Date End Date Taking? Authorizing Provider  albuterol (PROVENTIL HFA;VENTOLIN HFA) 108 (90 BASE) MCG/ACT inhaler Inhale 2 puffs into the lungs every 6 (six) hours as needed. For wheeze or shortness of breath   Yes Historical Provider, MD  calcium carbonate (TUMS - DOSED IN MG ELEMENTAL CALCIUM) 500 MG chewable tablet Chew 3 tablets by mouth as needed for indigestion or heartburn.  Yes Historical Provider, MD  Linaclotide Karlene Einstein(LINZESS) 145 MCG CAPS capsule Take 145 mcg by mouth daily.   Yes Historical Provider, MD  Multiple Vitamin (MULTIVITAMIN WITH MINERALS) TABS Take 1 tablet by mouth daily.   Yes Historical Provider, MD  oxyCODONE-acetaminophen (PERCOCET/ROXICET) 5-325 MG per tablet Take 1 tablet by mouth every 4 (four) hours as needed for severe pain.   Yes Historical Provider, MD  peppermint oil liquid Apply 1 application topically as needed (For headache). Rubs on  temples   Yes Historical Provider, MD  topiramate (TOPAMAX) 100 MG tablet Take 100 mg by mouth at bedtime.    Yes Historical Provider, MD  topiramate (TOPAMAX) 50 MG tablet Take 50 mg by mouth 2 (two) times daily. Takes 50mg  in the morning and 50mg  in the afternoon   Yes Historical Provider, MD  promethazine (PHENERGAN) 25 MG tablet Take 1 tablet (25 mg total) by mouth every 6 (six) hours as needed for nausea or vomiting. 04/10/14   Junius FinnerErin O'Malley, PA-C   BP 123/89  Pulse 86  Temp(Src) 98 F (36.7 C) (Oral)  Resp 16  SpO2 99% Physical Exam  Nursing note and vitals reviewed. Constitutional: She appears well-developed and well-nourished. No distress.  Pt lying in exam bed, NAD.  HENT:  Head: Normocephalic and atraumatic.  Eyes: Conjunctivae are normal. No scleral icterus.  Neck: Normal range of motion. Neck supple.  Cardiovascular: Normal rate, regular rhythm and normal heart sounds.   Pulmonary/Chest: Effort normal and breath sounds normal. No respiratory distress. She has no wheezes. She has no rales. She exhibits no tenderness.  Abdominal: Soft. Bowel sounds are normal. She exhibits no distension and no mass. There is tenderness. There is guarding. There is no rebound.  Soft, non-distended, diffuse tenderness worse in lower abdomen and LUQ, guarding. No rebound or masses.  Musculoskeletal: Normal range of motion.  Neurological: She is alert.  Skin: Skin is warm and dry. She is not diaphoretic.    ED Course  Procedures (including critical care time) Labs Review Labs Reviewed  CBC WITH DIFFERENTIAL - Abnormal; Notable for the following:    WBC 3.8 (*)    Hemoglobin 15.1 (*)    Neutrophils Relative % 24 (*)    Lymphocytes Relative 64 (*)    Neutro Abs 0.9 (*)    All other components within normal limits  COMPREHENSIVE METABOLIC PANEL - Abnormal; Notable for the following:    Potassium 3.2 (*)    BUN 4 (*)    Total Bilirubin 1.5 (*)    All other components within normal limits   URINALYSIS, ROUTINE W REFLEX MICROSCOPIC  LIPASE, BLOOD  PATHOLOGIST SMEAR REVIEW    Imaging Review Ct Abdomen Pelvis W Contrast  04/10/2014   CLINICAL DATA:  Mid to lower abdominal pain. History of prior gastric bypass surgery.  EXAM: CT ABDOMEN AND PELVIS WITH CONTRAST  TECHNIQUE: Multidetector CT imaging of the abdomen and pelvis was performed using the standard protocol following bolus administration of intravenous contrast.  CONTRAST:  100mL OMNIPAQUE IOHEXOL 300 MG/ML  SOLN  COMPARISON:  CT of the abdomen and pelvis 12/15/2012.  FINDINGS: Lung Bases: Unremarkable.  Abdomen/Pelvis: Status post cholecystectomy. Postoperative changes of gastric bypass procedure are noted. The appearance of the liver, pancreas, spleen, bilateral adrenal glands and bilateral kidneys is unremarkable. No significant volume of ascites. No pneumoperitoneum. No pathologic distention of small bowel. No definite lymphadenopathy identified within the abdomen or pelvis. Status post hysterectomy. Ovaries and unremarkable in appearance. Urinary bladder is normal in  appearance.  Musculoskeletal: There are no aggressive appearing lytic or blastic lesions noted in the visualized portions of the skeleton.  IMPRESSION: 1. No acute findings in the abdomen or pelvis to account for the patient's symptoms. 2. Postoperative changes, as above.   Electronically Signed   By: Trudie Reedaniel  Entrikin M.D.   On: 04/10/2014 10:10     EKG Interpretation None      MDM   Final diagnoses:  Abdominal pain  Nausea & vomiting    Pt with hx of gastric bypass in [place] in 2008, c/o abdominal pain, nausea and vomiting.  Pt reports having pelvic exam 3 days ago, was normal at that time, will not repeat today.  Will get labs. CBC, CMP, Lipase and UA.  Will get CT abd/pelvis w/ contrast.  Tx in ED: dilaudid, zofran, and IV fluids.  10:03 AM Pt still c/o abdominal pain after 1mg  diluadid but states nausea is better. CT abd still pending. Pt being given  IV K+ as she has hypokalemia with K+ of 3.2    CT abd: unremarkable. No acute findings to account for pt's symptoms. Discussed with pt, advised her to f/u with her GI specialist, Dr. Bosie ClosSchooler for further workup including previously scheduled endoscopy and colonoscopy. Return precautions provided. Pt verbalized understanding and agreement with tx plan.  Discussed pt with Dr. Freida BusmanAllen who also examined pt and agrees with tx plan.    Junius Finnerrin O'Malley, PA-C 04/10/14 1046

## 2014-04-10 NOTE — ED Provider Notes (Signed)
Medical screening examination/treatment/procedure(s) were conducted as a shared visit with non-physician practitioner(s) and myself.  I personally evaluated the patient during the encounter.   EKG Interpretation None     Patient here with abdominal pain x3 days that has progressively worse. Has been having chronic abdominal pain for quite some time. Her abdominal exam today is without signs of peritonitis. Will obtain CT scan  Catherine BakerAnthony T Emilyanne Mcgough, MD 04/10/14 [phone]

## 2014-04-10 NOTE — ED Notes (Signed)
Per pt, states she saw GI MD on Monday, was suppose to go have films done but she couldn't get out of bed-states increased pain on Wed, started treating herself with some PCN-not getting better

## 2014-04-10 NOTE — Discharge Instructions (Signed)
Abdominal Pain, Women °Abdominal (stomach, pelvic, or belly) pain can be caused by many things. It is important to tell your doctor: °· The location of the pain. °· Does it come and go or is it present all the time? °· Are there things that start the pain (eating certain foods, exercise)? °· Are there other symptoms associated with the pain (fever, nausea, vomiting, diarrhea)? °All of this is helpful to know when trying to find the cause of the pain. °CAUSES  °· Stomach: virus or bacteria infection, or ulcer. °· Intestine: appendicitis (inflamed appendix), regional ileitis (Crohn's disease), ulcerative colitis (inflamed colon), irritable bowel syndrome, diverticulitis (inflamed diverticulum of the colon), or cancer of the stomach or intestine. °· Gallbladder disease or stones in the gallbladder. °· Kidney disease, kidney stones, or infection. °· Pancreas infection or cancer. °· Fibromyalgia (pain disorder). °· Diseases of the female organs: °· Uterus: fibroid (non-cancerous) tumors or infection. °· Fallopian tubes: infection or tubal pregnancy. °· Ovary: cysts or tumors. °· Pelvic adhesions (scar tissue). °· Endometriosis (uterus lining tissue growing in the pelvis and on the pelvic organs). °· Pelvic congestion syndrome (female organs filling up with blood just before the menstrual period). °· Pain with the menstrual period. °· Pain with ovulation (producing an egg). °· Pain with an IUD (intrauterine device, birth control) in the uterus. °· Cancer of the female organs. °· Functional pain (pain not caused by a disease, may improve without treatment). °· Psychological pain. °· Depression. °DIAGNOSIS  °Your doctor will decide the seriousness of your pain by doing an examination. °· Blood tests. °· X-rays. °· Ultrasound. °· CT scan (computed tomography, special type of X-ray). °· MRI (magnetic resonance imaging). °· Cultures, for infection. °· Barium enema (dye inserted in the large intestine, to better view it with  X-rays). °· Colonoscopy (looking in intestine with a lighted tube). °· Laparoscopy (minor surgery, looking in abdomen with a lighted tube). °· Major abdominal exploratory surgery (looking in abdomen with a large incision). °TREATMENT  °The treatment will depend on the cause of the pain.  °· Many cases can be observed and treated at home. °· Over-the-counter medicines recommended by your caregiver. °· Prescription medicine. °· Antibiotics, for infection. °· Birth control pills, for painful periods or for ovulation pain. °· Hormone treatment, for endometriosis. °· Nerve blocking injections. °· Physical therapy. °· Antidepressants. °· Counseling with a psychologist or psychiatrist. °· Minor or major surgery. °HOME CARE INSTRUCTIONS  °· Do not take laxatives, unless directed by your caregiver. °· Take over-the-counter pain medicine only if ordered by your caregiver. Do not take aspirin because it can cause an upset stomach or bleeding. °· Try a clear liquid diet (broth or water) as ordered by your caregiver. Slowly move to a bland diet, as tolerated, if the pain is related to the stomach or intestine. °· Have a thermometer and take your temperature several times a day, and record it. °· Bed rest and sleep, if it helps the pain. °· Avoid sexual intercourse, if it causes pain. °· Avoid stressful situations. °· Keep your follow-up appointments and tests, as your caregiver orders. °· If the pain does not go away with medicine or surgery, you may try: °· Acupuncture. °· Relaxation exercises (yoga, meditation). °· Group therapy. °· Counseling. °SEEK MEDICAL CARE IF:  °· You notice certain foods cause stomach pain. °· Your home care treatment is not helping your pain. °· You need stronger pain medicine. °· You want your IUD removed. °· You feel faint or   lightheaded. °· You develop nausea and vomiting. °· You develop a rash. °· You are having side effects or an allergy to your medicine. °SEEK IMMEDIATE MEDICAL CARE IF:  °· Your  pain does not go away or gets worse. °· You have a fever. °· Your pain is felt only in portions of the abdomen. The right side could possibly be appendicitis. The left lower portion of the abdomen could be colitis or diverticulitis. °· You are passing blood in your stools (bright red or black tarry stools, with or without vomiting). °· You have blood in your urine. °· You develop chills, with or without a fever. °· You pass out. °MAKE SURE YOU:  °· Understand these instructions. °· Will watch your condition. °· Will get help right away if you are not doing well or get worse. °Document Released: 08/13/2007 Document Revised: 01/08/2012 Document Reviewed: 09/02/2009 °[employer]® Patient Information ©2014 [employer]. ° °

## 2014-04-10 NOTE — ED Notes (Signed)
Patient transported to CT 

## 2014-04-11 NOTE — ED Provider Notes (Signed)
Medical screening examination/treatment/procedure(s) were conducted as a shared visit with non-physician practitioner(s) and myself.  I personally evaluated the patient during the encounter.   EKG Interpretation None       Toy BakerAnthony T Esiquio Boesen, MD 04/11/14 1511

## 2014-06-01 ENCOUNTER — Ambulatory Visit
Admission: RE | Admit: 2014-06-01 | Discharge: 2014-06-01 | Disposition: A | Discharge status: Home / Self Care | Payer: Federal, State, Local not specified - PPO | Source: Ambulatory Visit

## 2014-06-01 DIAGNOSIS — Z1231 Encounter for screening mammogram for malignant neoplasm of breast: Secondary | ICD-10-CM

## 2014-08-16 ENCOUNTER — Emergency Department (HOSPITAL_COMMUNITY)
Admission: EM | Admit: 2014-08-16 | Discharge: 2014-08-16 | Discharge status: Against Medical Advice | Payer: Federal, State, Local not specified - PPO | Attending: Emergency Medicine | Admitting: Emergency Medicine

## 2014-08-16 ENCOUNTER — Encounter (HOSPITAL_COMMUNITY): Payer: Self-pay | Admitting: Emergency Medicine

## 2014-08-16 DIAGNOSIS — R197 Diarrhea, unspecified: Secondary | ICD-10-CM | POA: Insufficient documentation

## 2014-08-16 DIAGNOSIS — M791 Myalgia: Secondary | ICD-10-CM | POA: Insufficient documentation

## 2014-08-16 DIAGNOSIS — R531 Weakness: Secondary | ICD-10-CM | POA: Diagnosis not present

## 2014-08-16 DIAGNOSIS — R112 Nausea with vomiting, unspecified: Secondary | ICD-10-CM | POA: Insufficient documentation

## 2014-08-16 DIAGNOSIS — J45909 Unspecified asthma, uncomplicated: Secondary | ICD-10-CM | POA: Insufficient documentation

## 2014-08-16 MED ORDER — ONDANSETRON 4 MG PO TBDP: 8.0000 mg | ORAL_TABLET | Freq: Once | ORAL | Status: DC

## 2014-08-16 NOTE — ED Notes (Signed)
Unable to locate pt x2

## 2014-08-16 NOTE — ED Notes (Signed)
Informed pt at triage that we would be getting blood work, pt got up and walked herself back out to waiting room before having blood drawn.

## 2014-08-16 NOTE — ED Notes (Signed)
Unable to locate the pt 

## 2014-08-16 NOTE — ED Notes (Addendum)
Pt reports having bodyaches, n/v/d x 3 days with generalized weakness. Denies fever.

## 2014-08-16 NOTE — ED Notes (Signed)
Unable to locate the pt x 3 

## 2014-10-16 ENCOUNTER — Other Ambulatory Visit: Payer: Self-pay | Admitting: Psychiatry

## 2014-10-16 ENCOUNTER — Ambulatory Visit
Admission: RE | Admit: 2014-10-16 | Discharge: 2014-10-16 | Disposition: A | Discharge status: Home / Self Care | Payer: Federal, State, Local not specified - PPO | Source: Ambulatory Visit | Attending: Psychiatry | Admitting: Psychiatry

## 2014-10-16 DIAGNOSIS — M5489 Other dorsalgia: Secondary | ICD-10-CM

## 2015-07-14 ENCOUNTER — Encounter (HOSPITAL_COMMUNITY): Payer: Self-pay | Admitting: Emergency Medicine

## 2015-07-14 ENCOUNTER — Emergency Department (HOSPITAL_COMMUNITY)
Admission: EM | Admit: 2015-07-14 | Discharge: 2015-07-14 | Discharge status: Against Medical Advice | Payer: Federal, State, Local not specified - PPO | Attending: Emergency Medicine | Admitting: Emergency Medicine

## 2015-07-14 DIAGNOSIS — W108XXA Fall (on) (from) other stairs and steps, initial encounter: Secondary | ICD-10-CM | POA: Diagnosis not present

## 2015-07-14 DIAGNOSIS — S59902A Unspecified injury of left elbow, initial encounter: Secondary | ICD-10-CM | POA: Diagnosis not present

## 2015-07-14 DIAGNOSIS — Y9389 Activity, other specified: Secondary | ICD-10-CM | POA: Insufficient documentation

## 2015-07-14 DIAGNOSIS — Y9289 Other specified places as the place of occurrence of the external cause: Secondary | ICD-10-CM | POA: Diagnosis not present

## 2015-07-14 DIAGNOSIS — S8991XA Unspecified injury of right lower leg, initial encounter: Secondary | ICD-10-CM | POA: Insufficient documentation

## 2015-07-14 DIAGNOSIS — J45909 Unspecified asthma, uncomplicated: Secondary | ICD-10-CM | POA: Diagnosis not present

## 2015-07-14 DIAGNOSIS — S4991XA Unspecified injury of right shoulder and upper arm, initial encounter: Secondary | ICD-10-CM | POA: Diagnosis not present

## 2015-07-14 DIAGNOSIS — Y998 Other external cause status: Secondary | ICD-10-CM | POA: Insufficient documentation
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
## 2015-07-14 NOTE — ED Notes (Signed)
Called patient for a treatment room in the back patient did not answer

## 2015-10-25 ENCOUNTER — Encounter (HOSPITAL_COMMUNITY): Payer: Self-pay | Admitting: Emergency Medicine

## 2015-10-25 ENCOUNTER — Emergency Department (HOSPITAL_COMMUNITY)
Admission: EM | Admit: 2015-10-25 | Discharge: 2015-10-25 | Disposition: A | Discharge status: Home / Self Care | Payer: Federal, State, Local not specified - PPO | Attending: Emergency Medicine | Admitting: Emergency Medicine

## 2015-10-25 ENCOUNTER — Emergency Department (HOSPITAL_COMMUNITY): Payer: Federal, State, Local not specified - PPO

## 2015-10-25 DIAGNOSIS — Z79899 Other long term (current) drug therapy: Secondary | ICD-10-CM | POA: Insufficient documentation

## 2015-10-25 DIAGNOSIS — Y9289 Other specified places as the place of occurrence of the external cause: Secondary | ICD-10-CM | POA: Diagnosis not present

## 2015-10-25 DIAGNOSIS — S0990XA Unspecified injury of head, initial encounter: Secondary | ICD-10-CM | POA: Diagnosis present

## 2015-10-25 DIAGNOSIS — S060X0A Concussion without loss of consciousness, initial encounter: Secondary | ICD-10-CM | POA: Diagnosis not present

## 2015-10-25 DIAGNOSIS — J45909 Unspecified asthma, uncomplicated: Secondary | ICD-10-CM | POA: Insufficient documentation

## 2015-10-25 DIAGNOSIS — Y9389 Activity, other specified: Secondary | ICD-10-CM | POA: Insufficient documentation

## 2015-10-25 DIAGNOSIS — Z3202 Encounter for pregnancy test, result negative: Secondary | ICD-10-CM | POA: Diagnosis not present

## 2015-10-25 DIAGNOSIS — Y998 Other external cause status: Secondary | ICD-10-CM | POA: Insufficient documentation

## 2015-10-25 DIAGNOSIS — M810 Age-related osteoporosis without current pathological fracture: Secondary | ICD-10-CM | POA: Diagnosis not present

## 2015-10-25 DIAGNOSIS — S1093XA Contusion of unspecified part of neck, initial encounter: Secondary | ICD-10-CM

## 2015-10-25 DIAGNOSIS — N390 Urinary tract infection, site not specified: Secondary | ICD-10-CM | POA: Diagnosis not present

## 2015-10-25 DIAGNOSIS — E876 Hypokalemia: Secondary | ICD-10-CM

## 2015-10-25 LAB — COMPREHENSIVE METABOLIC PANEL
ALK PHOS: 46 U/L (ref 38–126)
ALT: 13 U/L — ABNORMAL LOW (ref 14–54)
ANION GAP: 9 (ref 5–15)
AST: 25 U/L (ref 15–41)
Albumin: 4 g/dL (ref 3.5–5.0)
BILIRUBIN TOTAL: 1.1 mg/dL (ref 0.3–1.2)
BUN: 10 mg/dL (ref 6–20)
CALCIUM: 9.1 mg/dL (ref 8.9–10.3)
CO2: 32 mmol/L (ref 22–32)
Chloride: 103 mmol/L (ref 101–111)
Creatinine, Ser: 0.76 mg/dL (ref 0.44–1.00)
GFR calc Af Amer: >60 mL/min (ref >60)
Glucose, Bld: 62 mg/dL — ABNORMAL LOW (ref 65–99)
Potassium: 2.7 mmol/L — CL (ref 3.5–5.1)
Sodium: 144 mmol/L (ref 135–145)
TOTAL PROTEIN: 6.7 g/dL (ref 6.5–8.1)

## 2015-10-25 LAB — URINALYSIS, ROUTINE W REFLEX MICROSCOPIC
Glucose, UA: NEGATIVE mg/dL
KETONES UR: NEGATIVE mg/dL
NITRITE: POSITIVE — AB
PH: 6 (ref 5.0–8.0)
Protein, ur: NEGATIVE mg/dL
Specific Gravity, Urine: 1.031 — ABNORMAL HIGH (ref 1.005–1.030)

## 2015-10-25 LAB — CBC
HEMATOCRIT: 39.4 % (ref 36.0–46.0)
Hemoglobin: 13.3 g/dL (ref 12.0–15.0)
MCH: 33.1 pg (ref 26.0–34.0)
MCHC: 33.8 g/dL (ref 30.0–36.0)
MCV: 98 fL (ref 78.0–100.0)
Platelets: 255 10*3/uL (ref 150–400)
RBC: 4.02 MIL/uL (ref 3.87–5.11)
RDW: 13.4 % (ref 11.5–15.5)
WBC: 3.3 10*3/uL — AB (ref 4.0–10.5)

## 2015-10-25 LAB — URINE MICROSCOPIC-ADD ON

## 2015-10-25 LAB — LIPASE, BLOOD: Lipase: 28 U/L (ref 11–51)

## 2015-10-25 LAB — POC URINE PREG, ED: Preg Test, Ur: NEGATIVE

## 2015-10-25 MED ORDER — ONDANSETRON 8 MG PO TBDP
8.0000 mg | ORAL_TABLET | Freq: Once | ORAL | Status: AC
  Administered 2015-10-25: 8 mg via ORAL
  Filled 2015-10-25: qty 1

## 2015-10-25 MED ORDER — IBUPROFEN 600 MG PO TABS: 600.0000 mg | ORAL_TABLET | Freq: Four times a day (QID) | ORAL | Status: DC | PRN

## 2015-10-25 MED ORDER — POTASSIUM CHLORIDE CRYS ER 20 MEQ PO TBCR: 20.0000 meq | EXTENDED_RELEASE_TABLET | Freq: Two times a day (BID) | ORAL | Status: AC

## 2015-10-25 MED ORDER — POTASSIUM CHLORIDE CRYS ER 20 MEQ PO TBCR
40.0000 meq | EXTENDED_RELEASE_TABLET | Freq: Once | ORAL | Status: AC
  Administered 2015-10-25: 40 meq via ORAL
  Filled 2015-10-25: qty 2

## 2015-10-25 MED ORDER — KETOROLAC TROMETHAMINE 60 MG/2ML IM SOLN
60.0000 mg | Freq: Once | INTRAMUSCULAR | Status: AC
  Administered 2015-10-25: 60 mg via INTRAMUSCULAR
  Filled 2015-10-25: qty 2

## 2015-10-25 MED ORDER — CEPHALEXIN 250 MG PO CAPS: 250.0000 mg | ORAL_CAPSULE | Freq: Four times a day (QID) | ORAL | Status: AC
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
## 2015-10-25 NOTE — Discharge Instructions (Signed)
Ibuprofen or tylenol for pain. Keflex as prescribed until all gone for UTI. Rest. Drink plenty of fluids. Follow up with primary care doctor as needed. See information below.   Concussion, Adult A concussion, or closed-head injury, is a brain injury caused by a direct blow to the head or by a quick and sudden movement (jolt) of the head or neck. Concussions are usually not life-threatening. Even so, the effects of a concussion can be serious. If you have had a concussion before, you are more likely to experience concussion-like symptoms after a direct blow to the head.  CAUSES  Direct blow to the head, such as from running into another player during a soccer game, being hit in a fight, or hitting your head on a hard surface.  A jolt of the head or neck that causes the brain to move back and forth inside the skull, such as in a car crash. SIGNS AND SYMPTOMS The signs of a concussion can be hard to notice. Early on, they may be missed by you, family members, and health care providers. You may look fine but act or feel differently. Symptoms are usually temporary, but they may last for days, weeks, or even longer. Some symptoms may appear right away while others may not show up for hours or days. Every head injury is different. Symptoms include:  Mild to moderate headaches that will not go away.  A feeling of pressure inside your head.  Having more trouble than usual:  Learning or remembering things you have heard.  Answering questions.  Paying attention or concentrating.  Organizing daily tasks.  Making decisions and solving problems.  Slowness in thinking, acting or reacting, speaking, or reading.  Getting lost or being easily confused.  Feeling tired all the time or lacking energy (fatigued).  Feeling drowsy.  Sleep disturbances.  Sleeping more than usual.  Sleeping less than usual.  Trouble falling asleep.  Trouble sleeping (insomnia).  Loss of balance or feeling  lightheaded or dizzy.  Nausea or vomiting.  Numbness or tingling.  Increased sensitivity to:  Sounds.  Lights.  Distractions.  Vision problems or eyes that tire easily.  Diminished sense of taste or smell.  Ringing in the ears.  Mood changes such as feeling sad or anxious.  Becoming easily irritated or angry for little or no reason.  Lack of motivation.  Seeing or hearing things other people do not see or hear (hallucinations). DIAGNOSIS Your health care provider can usually diagnose a concussion based on a description of your injury and symptoms. He or she will ask whether you passed out (lost consciousness) and whether you are having trouble remembering events that happened right before and during your injury. Your evaluation might include:  A brain scan to look for signs of injury to the brain. Even if the test shows no injury, you may still have a concussion.  Blood tests to be sure other problems are not present. TREATMENT  Concussions are usually treated in an emergency department, in urgent care, or at a clinic. You may need to stay in the hospital overnight for further treatment.  Tell your health care provider if you are taking any medicines, including prescription medicines, over-the-counter medicines, and natural remedies. Some medicines, such as blood thinners (anticoagulants) and aspirin, may increase the chance of complications. Also tell your health care provider whether you have had alcohol or are taking illegal drugs. This information may affect treatment.  Your health care provider will send you home with important  instructions to follow.  How fast you will recover from a concussion depends on many factors. These factors include how severe your concussion is, what part of your brain was injured, your age, and how healthy you were before the concussion.  Most people with mild injuries recover fully. Recovery can take time. In general, recovery is slower in  older persons. Also, persons who have had a concussion in the past or have other medical problems may find that it takes longer to recover from their current injury. HOME CARE INSTRUCTIONS General Instructions  Carefully follow the directions your health care provider gave you.  Only take over-the-counter or prescription medicines for pain, discomfort, or fever as directed by your health care provider.  Take only those medicines that your health care provider has approved.  Do not drink alcohol until your health care provider says you are well enough to do so. Alcohol and certain other drugs may slow your recovery and can put you at risk of further injury.  If it is harder than usual to remember things, write them down.  If you are easily distracted, try to do one thing at a time. For example, do not try to watch TV while fixing dinner.  Talk with family members or close friends when making important decisions.  Keep all follow-up appointments. Repeated evaluation of your symptoms is recommended for your recovery.  Watch your symptoms and tell others to do the same. Complications sometimes occur after a concussion. Older adults with a brain injury may have a higher risk of serious complications, such as a blood clot on the brain.  Tell your teachers, school nurse, school counselor, coach, athletic trainer, or work Production designer, theatre/television/film about your injury, symptoms, and restrictions. Tell them about what you can or cannot do. They should watch for:  Increased problems with attention or concentration.  Increased difficulty remembering or learning new information.  Increased time needed to complete tasks or assignments.  Increased irritability or decreased ability to cope with stress.  Increased symptoms.  Rest. Rest helps the brain to heal. Make sure you:  Get plenty of sleep at night. Avoid staying up late at night.  Keep the same bedtime hours on weekends and weekdays.  Rest during the day.  Take daytime naps or rest breaks when you feel tired.  Limit activities that require a lot of thought or concentration. These include:  Doing homework or job-related work.  Watching TV.  Working on the computer.  Avoid any situation where there is potential for another head injury (football, hockey, soccer, basketball, martial arts, downhill snow sports and horseback riding). Your condition will get worse every time you experience a concussion. You should avoid these activities until you are evaluated by the appropriate follow-up health care providers. Returning To Your Regular Activities You will need to return to your normal activities slowly, not all at once. You must give your body and brain enough time for recovery.  Do not return to sports or other athletic activities until your health care provider tells you it is safe to do so.  Ask your health care provider when you can drive, ride a bicycle, or operate heavy machinery. Your ability to react may be slower after a brain injury. Never do these activities if you are dizzy.  Ask your health care provider about when you can return to work or school. Preventing Another Concussion It is very important to avoid another brain injury, especially before you have recovered. In rare cases, another injury  can lead to permanent brain damage, brain swelling, or death. The risk of this is greatest during the first 7-10 days after a head injury. Avoid injuries by:  Wearing a seat belt when riding in a car.  Drinking alcohol only in moderation.  Wearing a helmet when biking, skiing, skateboarding, skating, or doing similar activities.  Avoiding activities that could lead to a second concussion, such as contact or recreational sports, until your health care provider says it is okay.  Taking safety measures in your home.  Remove clutter and tripping hazards from floors and stairways.  Use grab bars in bathrooms and handrails by stairs.  Place  non-slip mats on floors and in bathtubs.  Improve lighting in dim areas. SEEK MEDICAL CARE IF:  You have increased problems paying attention or concentrating.  You have increased difficulty remembering or learning new information.  You need more time to complete tasks or assignments than before.  You have increased irritability or decreased ability to cope with stress.  You have more symptoms than before. Seek medical care if you have any of the following symptoms for more than 2 weeks after your injury:  Lasting (chronic) headaches.  Dizziness or balance problems.  Nausea.  Vision problems.  Increased sensitivity to noise or light.  Depression or mood swings.  Anxiety or irritability.  Memory problems.  Difficulty concentrating or paying attention.  Sleep problems.  Feeling tired all the time. SEEK IMMEDIATE MEDICAL CARE IF:  You have severe or worsening headaches. These may be a sign of a blood clot in the brain.  You have weakness (even if only in one hand, leg, or part of the face).  You have numbness.  You have decreased coordination.  You vomit repeatedly.  You have increased sleepiness.  One pupil is larger than the other.  You have convulsions.  You have slurred speech.  You have increased confusion. This may be a sign of a blood clot in the brain.  You have increased restlessness, agitation, or irritability.  You are unable to recognize people or places.  You have neck pain.  It is difficult to wake you up.  You have unusual behavior changes.  You lose consciousness. MAKE SURE YOU:  Understand these instructions.  Will watch your condition.  Will get help right away if you are not doing well or get worse.   This information is not intended to replace advice given to you by your health care provider. Make sure you discuss any questions you have with your health care provider.   Document Released: 01/06/2004 Document Revised:  11/06/2014 Document Reviewed: 05/08/2013 Elsevier Interactive Patient Education 2016 [employer]  Contusion A contusion is a deep bruise. Contusions are the result of a blunt injury to tissues and muscle fibers under the skin. The injury causes bleeding under the skin. The skin overlying the contusion may turn blue, purple, or yellow. Minor injuries will give you a painless contusion, but more severe contusions may stay painful and swollen for a few weeks.  CAUSES  This condition is usually caused by a blow, trauma, or direct force to an area of the body. SYMPTOMS  Symptoms of this condition include:  Swelling of the injured area.  Pain and tenderness in the injured area.  Discoloration. The area may have redness and then turn blue, purple, or yellow. DIAGNOSIS  This condition is diagnosed based on a physical exam and medical history. An X-ray, CT scan, or MRI may be needed to determine if  there are any associated injuries, such as broken bones (fractures). TREATMENT  Specific treatment for this condition depends on what area of the body was injured. In general, the best treatment for a contusion is resting, icing, applying pressure to (compression), and elevating the injured area. This is often called the RICE strategy. Over-the-counter anti-inflammatory medicines may also be recommended for pain control.  HOME CARE INSTRUCTIONS   Rest the injured area.  If directed, apply ice to the injured area:  Put ice in a plastic bag.  Place a towel between your skin and the bag.  Leave the ice on for 20 minutes, 2-3 times per day.  If directed, apply light compression to the injured area using an elastic bandage. Make sure the bandage is not wrapped too tightly. Remove and reapply the bandage as directed by your health care provider.  If possible, raise (elevate) the injured area above the level of your heart while you are sitting or lying down.  Take over-the-counter and prescription  medicines only as told by your health care provider. SEEK MEDICAL CARE IF:  Your symptoms do not improve after several days of treatment.  Your symptoms get worse.  You have difficulty moving the injured area. SEEK IMMEDIATE MEDICAL CARE IF:   You have severe pain.  You have numbness in a hand or foot.  Your hand or foot turns pale or cold.   This information is not intended to replace advice given to you by your health care provider. Make sure you discuss any questions you have with your health care provider.   Document Released: 07/26/2005 Document Revised: 07/07/2015 Document Reviewed: 03/03/2015 Elsevier Interactive Patient Education [employer]2016 Else[employer] Inc[employer]

## 2015-10-25 NOTE — ED Notes (Signed)
Patient transported to CT 

## 2015-10-25 NOTE — ED Notes (Signed)
Bed: WA09 Expected date:  Expected time:  Means of arrival:  Comments: No monitor 
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
## 2015-11-07 ENCOUNTER — Emergency Department (HOSPITAL_COMMUNITY)
Admission: EM | Admit: 2015-11-07 | Discharge: 2015-11-07 | Disposition: A | Discharge status: Home / Self Care | Payer: Federal, State, Local not specified - PPO | Attending: Emergency Medicine | Admitting: Emergency Medicine

## 2015-11-07 ENCOUNTER — Encounter (HOSPITAL_COMMUNITY): Payer: Self-pay

## 2015-11-07 ENCOUNTER — Emergency Department (HOSPITAL_COMMUNITY): Payer: Federal, State, Local not specified - PPO

## 2015-11-07 DIAGNOSIS — W108XXA Fall (on) (from) other stairs and steps, initial encounter: Secondary | ICD-10-CM | POA: Insufficient documentation

## 2015-11-07 DIAGNOSIS — Y92512 Supermarket, store or market as the place of occurrence of the external cause: Secondary | ICD-10-CM | POA: Diagnosis not present

## 2015-11-07 DIAGNOSIS — Y998 Other external cause status: Secondary | ICD-10-CM | POA: Diagnosis not present

## 2015-11-07 DIAGNOSIS — J45909 Unspecified asthma, uncomplicated: Secondary | ICD-10-CM | POA: Insufficient documentation

## 2015-11-07 DIAGNOSIS — S299XXA Unspecified injury of thorax, initial encounter: Secondary | ICD-10-CM | POA: Insufficient documentation

## 2015-11-07 DIAGNOSIS — M810 Age-related osteoporosis without current pathological fracture: Secondary | ICD-10-CM | POA: Insufficient documentation

## 2015-11-07 DIAGNOSIS — Y9389 Activity, other specified: Secondary | ICD-10-CM | POA: Insufficient documentation

## 2015-11-07 DIAGNOSIS — S3991XA Unspecified injury of abdomen, initial encounter: Secondary | ICD-10-CM | POA: Diagnosis not present

## 2015-11-07 DIAGNOSIS — S0990XA Unspecified injury of head, initial encounter: Secondary | ICD-10-CM | POA: Insufficient documentation

## 2015-11-07 DIAGNOSIS — T148 Other injury of unspecified body region: Secondary | ICD-10-CM | POA: Insufficient documentation

## 2015-11-07 DIAGNOSIS — T07XXXA Unspecified multiple injuries, initial encounter: Secondary | ICD-10-CM

## 2015-11-07 DIAGNOSIS — Z79899 Other long term (current) drug therapy: Secondary | ICD-10-CM | POA: Insufficient documentation

## 2015-11-07 DIAGNOSIS — Z9884 Bariatric surgery status: Secondary | ICD-10-CM | POA: Diagnosis not present

## 2015-11-07 DIAGNOSIS — S199XXA Unspecified injury of neck, initial encounter: Secondary | ICD-10-CM | POA: Diagnosis present

## 2015-11-07 DIAGNOSIS — S3992XA Unspecified injury of lower back, initial encounter: Secondary | ICD-10-CM | POA: Insufficient documentation

## 2015-11-07 LAB — CBC WITH DIFFERENTIAL/PLATELET
BASOS ABS: 0 10*3/uL (ref 0.0–0.1)
BASOS PCT: 0 %
Eosinophils Absolute: 0 10*3/uL (ref 0.0–0.7)
Eosinophils Relative: 1 %
HEMATOCRIT: 41.7 % (ref 36.0–46.0)
Hemoglobin: 14.2 g/dL (ref 12.0–15.0)
LYMPHS PCT: 34 %
Lymphs Abs: 1.1 10*3/uL (ref 0.7–4.0)
MCH: 32.8 pg (ref 26.0–34.0)
MCHC: 34.1 g/dL (ref 30.0–36.0)
MCV: 96.3 fL (ref 78.0–100.0)
Monocytes Absolute: 0.2 10*3/uL (ref 0.1–1.0)
Monocytes Relative: 5 %
NEUTROS ABS: 2 10*3/uL (ref 1.7–7.7)
Neutrophils Relative %: 60 %
PLATELETS: 202 10*3/uL (ref 150–400)
RBC: 4.33 MIL/uL (ref 3.87–5.11)
RDW: 13 % (ref 11.5–15.5)
WBC: 3.3 10*3/uL — AB (ref 4.0–10.5)

## 2015-11-07 LAB — BASIC METABOLIC PANEL
ANION GAP: 12 (ref 5–15)
BUN: 9 mg/dL (ref 6–20)
CALCIUM: 9.3 mg/dL (ref 8.9–10.3)
CO2: 24 mmol/L (ref 22–32)
Chloride: 106 mmol/L (ref 101–111)
Creatinine, Ser: 0.59 mg/dL (ref 0.44–1.00)
GLUCOSE: 105 mg/dL — AB (ref 65–99)
POTASSIUM: 4.1 mmol/L (ref 3.5–5.1)
Sodium: 142 mmol/L (ref 135–145)

## 2015-11-07 MED ORDER — HYDROCODONE-ACETAMINOPHEN 5-325 MG PO TABS: 2.0000 | ORAL_TABLET | ORAL | Status: AC | PRN

## 2015-11-07 MED ORDER — ONDANSETRON HCL 4 MG/2ML IJ SOLN
4.0000 mg | Freq: Once | INTRAMUSCULAR | Status: AC
  Administered 2015-11-07: 4 mg via INTRAVENOUS
  Filled 2015-11-07: qty 2

## 2015-11-07 MED ORDER — IBUPROFEN 800 MG PO TABS: 800.0000 mg | ORAL_TABLET | Freq: Three times a day (TID) | ORAL | Status: AC

## 2015-11-07 MED ORDER — MORPHINE SULFATE (PF) 4 MG/ML IV SOLN
4.0000 mg | INTRAVENOUS | Status: AC | PRN
  Administered 2015-11-07 (×3): 4 mg via INTRAVENOUS
  Filled 2015-11-07 (×3): qty 1

## 2015-11-07 MED ORDER — IOHEXOL 300 MG/ML  SOLN
100.0000 mL | Freq: Once | INTRAMUSCULAR | Status: AC | PRN
  Administered 2015-11-07: 100 mL via INTRAVENOUS

## 2015-11-07 NOTE — Discharge Instructions (Signed)
Rest. Pain medication as needed.  Ice to painful spots.  Slowly increase activity as your symptoms improve over the next several days

## 2015-11-07 NOTE — ED Notes (Addendum)
Pt awake and reported that she does not remember falling but c/o posterior neck, upper back and abd pain without n/v/d s/p to falling down 18 steps. Applied c-collar. Dr. Fayrene FearingJames at bedside to do portable US of abd. Full ROM with ext, no obvious deformity, no swelling or bruising noted, (+)PMS, CRT brisk.
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
## 2015-11-07 NOTE — ED Provider Notes (Addendum)
CSN: [id number]     Arrival date & time 11/07/15  [id number] History   First MD Initiated Contact with Patient 11/07/15 0825     Chief Complaint  Patient presents with  . Fall      HPI  Patient presents for evaluation via EMS with complaint of a fall downstairs. Husband went to the store. When he came home he apparently heard the commotion. Found his wife at the bottom of their stairs. She was "groggy" but coming to. Complaint of pain in her neck and back. Transferred here for evaluation.  Is amnestic for the event. Does recall her events of today prior to her episode.    Past Medical History  Diagnosis Date  . Asthma   . Osteoporosis   . PONV (postoperative nausea and vomiting)   . Tear of articular cartilage of right knee as current injury 05/10/2012   Past Surgical History  Procedure Laterality Date  . Gastric bypass  2008    lost 150 lb  . Hernia repair  2008    ventral hernia  . Abdominal surgery      plasty  . Abdominal hysterectomy    . Knee arthroscopy  05/10/2012    Procedure: ARTHROSCOPY KNEE;  Surgeon: Eulas PostJoshua P Landau, MD;  Location: [facility];  Service: Orthopedics;  Laterality: Right;  Microfracture medial femoral condyle  . Arm surgery Right    Family History  Problem Relation Age of Onset  . Diabetes Mother   . Hypertension Father    Social History  Substance Use Topics  . Smoking status: Never Smoker   . Smokeless tobacco: Never Used  . Alcohol Use: Yes     Comment: occ   OB History    No data available     Review of Systems  Constitutional: Negative for fever, chills, diaphoresis, appetite change and fatigue.  HENT: Negative for mouth sores, sore throat and trouble swallowing.   Eyes: Negative for visual disturbance.  Respiratory: Negative for cough, chest tightness, shortness of breath and wheezing.   Cardiovascular: Positive for chest pain.  Gastrointestinal: Positive for abdominal pain. Negative for nausea, vomiting, diarrhea and  abdominal distention.  Endocrine: Negative for polydipsia, polyphagia and polyuria.  Genitourinary: Negative for dysuria, frequency and hematuria.  Musculoskeletal: Positive for myalgias, back pain, arthralgias and neck pain. Negative for gait problem.  Skin: Negative for color change, pallor and rash.  Neurological: Positive for headaches. Negative for dizziness, syncope and light-headedness.  Hematological: Does not bruise/bleed easily.  Psychiatric/Behavioral: Negative for behavioral problems and confusion.      Allergies  Aspirin and Tomato  Home Medications   Prior to Admission medications   Medication Sig Start Date End Date Taking? Authorizing Provider  albuterol (PROVENTIL HFA;VENTOLIN HFA) 108 (90 BASE) MCG/ACT inhaler Inhale 2 puffs into the lungs every 4 (four) hours as needed for wheezing or shortness of breath.    Yes Historical Provider, MD  calcium carbonate (TUMS - DOSED IN MG ELEMENTAL CALCIUM) 500 MG chewable tablet Chew 2 tablets by mouth daily as needed for indigestion or heartburn.    Yes Historical Provider, MD  cholecalciferol (VITAMIN D) 1000 UNITS tablet Take 1,000 Units by mouth once a week.    Yes Historical Provider, MD  Multiple Vitamin (MULTIVITAMIN WITH MINERALS) TABS Take 1 tablet by mouth daily.   Yes Historical Provider, MD  peppermint oil liquid Apply 1 application topically as needed (For headache). Rubs on temples   Yes Historical Provider, MD  potassium chloride  SA (K-DUR,KLOR-CON) 20 MEQ tablet Take 1 tablet (20 mEq total) by mouth 2 (two) times daily. 10/25/15  Yes Tatyana Kirichenko, PA-C  cephALEXin (KEFLEX) 250 MG capsule Take 1 capsule (250 mg total) by mouth 4 (four) times daily. Patient not taking: Reported on 11/07/2015 10/25/15   Jaynie Crumble, PA-C  HYDROcodone-acetaminophen (NORCO/VICODIN) 5-325 MG tablet Take 2 tablets by mouth every 4 (four) hours as needed. 11/07/15   Rolland Porter, MD  ibuprofen (ADVIL,MOTRIN) 800 MG tablet Take 1 tablet  (800 mg total) by mouth 3 (three) times daily. 11/07/15   Rolland Porter, MD  promethazine (PHENERGAN) 25 MG tablet Take 1 tablet (25 mg total) by mouth every 6 (six) hours as needed for nausea or vomiting. Patient not taking: Reported on 10/25/2015 04/10/14   Junius Finner, PA-C   BP 139/90 mmHg  Pulse 77  Temp(Src) 98 F (36.7 C) (Oral)  Resp 20  SpO2 97% Physical Exam  Constitutional: She is oriented to person, place, and time. She appears well-developed and well-nourished. No distress.  HENT:  Head: Normocephalic.  No obvious overt trauma the head. No soft tissue swelling. No blood over the TMs, mastoids, or from ears nose or mouth.  Cranial nerves intact, symmetric Midline and paraspinal inferior neck pain. Is an immobilizer.  Eyes: Conjunctivae are normal. Pupils are equal, round, and reactive to light. No scleral icterus.  Neck: Normal range of motion. Neck supple. No thyromegaly present.  Cardiovascular: Normal rate and regular rhythm.  Exam reveals no gallop and no friction rub.   No murmur heard. Pulmonary/Chest: Effort normal and breath sounds normal. No respiratory distress. She has no wheezes. She has no rales.  Tenderness in the left lower chest wall.  Abdominal: Soft. Bowel sounds are normal. She exhibits no distension. There is no tenderness. There is no rebound.  Claims of mild diffuse abdominal tenderness. Pelvis stable.  Musculoskeletal: Normal range of motion.  Neurological: She is alert and oriented to person, place, and time.  Skin: Skin is warm and dry. No rash noted.  Psychiatric: She has a normal mood and affect. Her behavior is normal.    ED Course  Procedures (including critical care time) Labs Review Labs Reviewed  CBC WITH DIFFERENTIAL/PLATELET - Abnormal; Notable for the following:    WBC 3.3 (*)    All other components within normal limits  BASIC METABOLIC PANEL - Abnormal; Notable for the following:    Glucose, Bld 105 (*)    All other components within  normal limits    Imaging Review Ct Head Wo Contrast  11/07/2015  CLINICAL DATA:  45 year old female status post fall down 13 stairs EXAM: CT HEAD WITHOUT CONTRAST CT CERVICAL SPINE WITHOUT CONTRAST TECHNIQUE: Multidetector CT imaging of the head and cervical spine was performed following the standard protocol without intravenous contrast. Multiplanar CT image reconstructions of the cervical spine were also generated. COMPARISON:  None. FINDINGS: CT HEAD FINDINGS Negative for acute intracranial hemorrhage, acute infarction, mass, mass effect, hydrocephalus or midline shift. Gray-white differentiation is preserved throughout. No focal soft tissue or scalp hematoma. No calvarial fracture. Normal aeration of the mastoid air cells and visualized paranasal sinuses. CT CERVICAL SPINE FINDINGS No acute fracture, malalignment or prevertebral soft tissue swelling. Unremarkable CT appearance of the thyroid gland. No acute soft tissue abnormality. The lung apices are unremarkable. IMPRESSION: CT HEAD 1. Negative CT CSPINE 1. Negative Electronically Signed   By: Malachy Moan M.D.   On: 11/07/2015 09:12   Ct Chest W Contrast  11/07/2015  :  CLINICAL DATA: 45 year old female status post fall down 13 stairs EXAM: CT CHEST, ABDOMEN, AND PELVIS WITH CONTRAST TECHNIQUE: Multidetector CT imaging of the chest, abdomen and pelvis was performed following the standard protocol during bolus administration of intravenous contrast. CONTRAST: OMNIPAQUE IOHEXOL 300 MG/ML  SOLN COMPARISON: Concurrently obtained CT scan of the head and cervical spine FINDINGS: CT CHEST Mediastinum: Unremarkable CT appearance of the thyroid gland. No suspicious mediastinal or hilar adenopathy. No soft tissue mediastinal mass. The thoracic esophagus is unremarkable. Heart/Vascular: Conventional 3 vessel arch anatomy. No evidence of acute aortic injury. Normal cardiac size. No pericardial effusion. Normal size main and central pulmonary arteries. No  central PE. Lungs/Pleura: The lungs are clear. Bones/Soft Tissues: No acute fracture or aggressive appearing lytic or blastic osseous lesion. CT ABDOMEN AND PELVIS Abdomen: Surgical changes of prior gastric bypass procedure. No evidence of obstruction or complication. Unremarkable CT appearance of the spleen, adrenal glands and pancreas. Normal hepatic contour morphology. No discrete hepatic lesion. The liver is mildly low in attenuation suggesting fatty infiltration. The gallbladder is surgically absent. No significant intra extrahepatic biliary ductal dilatation. Unremarkable appearance of the bilateral kidneys. No focal solid lesion, hydronephrosis or nephrolithiasis. No evidence of bowel obstruction or focal bowel wall thickening. Pelvic floor laxity is noted incidentally. No free fluid or suspicious adenopathy. Pelvis: Pelvic floor laxity. Unremarkable appearance of the bladder. No free fluid or suspicious adenopathy. Bones/Soft Tissues: No acute fracture or aggressive appearing lytic or blastic osseous lesion. Incidental note is made of incomplete fusion of the inferior articulating process on the right at L2. Vascular: No significant atherosclerotic vascular disease, aneurysmal dilatation or acute abnormality. IMPRESSION: CT CHEST No acute injury to the thorax. Unremarkable CT appearance of the chest. CT ABD/PELVIS No acute injury to the abdomen or pelvis. Surgical changes of prior cholecystectomy and gastric bypass surgery without evidence of complication. Mild hepatic steatosis. Pelvic floor laxity. Electronically Signed   By: Malachy Moan M.D.   On: 11/07/2015 09:44   Ct Cervical Spine Wo Contrast  11/07/2015  CLINICAL DATA:  45 year old female status post fall down 13 stairs EXAM: CT HEAD WITHOUT CONTRAST CT CERVICAL SPINE WITHOUT CONTRAST TECHNIQUE: Multidetector CT imaging of the head and cervical spine was performed following the standard protocol without intravenous contrast. Multiplanar CT  image reconstructions of the cervical spine were also generated. COMPARISON:  None. FINDINGS: CT HEAD FINDINGS Negative for acute intracranial hemorrhage, acute infarction, mass, mass effect, hydrocephalus or midline shift. Gray-white differentiation is preserved throughout. No focal soft tissue or scalp hematoma. No calvarial fracture. Normal aeration of the mastoid air cells and visualized paranasal sinuses. CT CERVICAL SPINE FINDINGS No acute fracture, malalignment or prevertebral soft tissue swelling. Unremarkable CT appearance of the thyroid gland. No acute soft tissue abnormality. The lung apices are unremarkable. IMPRESSION: CT HEAD 1. Negative CT CSPINE 1. Negative Electronically Signed   By: Malachy Moan M.D.   On: 11/07/2015 09:12   Ct Abdomen Pelvis W Contrast  11/07/2015  : CLINICAL DATA: 45 year old female status post fall down 13 stairs EXAM: CT CHEST, ABDOMEN, AND PELVIS WITH CONTRAST TECHNIQUE: Multidetector CT imaging of the chest, abdomen and pelvis was performed following the standard protocol during bolus administration of intravenous contrast. CONTRAST: OMNIPAQUE IOHEXOL 300 MG/ML  SOLN COMPARISON: Concurrently obtained CT scan of the head and cervical spine FINDINGS: CT CHEST Mediastinum: Unremarkable CT appearance of the thyroid gland. No suspicious mediastinal or hilar adenopathy. No soft tissue mediastinal mass. The thoracic esophagus is unremarkable. Heart/Vascular:  Conventional 3 vessel arch anatomy. No evidence of acute aortic injury. Normal cardiac size. No pericardial effusion. Normal size main and central pulmonary arteries. No central PE. Lungs/Pleura: The lungs are clear. Bones/Soft Tissues: No acute fracture or aggressive appearing lytic or blastic osseous lesion. CT ABDOMEN AND PELVIS Abdomen: Surgical changes of prior gastric bypass procedure. No evidence of obstruction or complication. Unremarkable CT appearance of the spleen, adrenal glands and pancreas. Normal  hepatic contour morphology. No discrete hepatic lesion. The liver is mildly low in attenuation suggesting fatty infiltration. The gallbladder is surgically absent. No significant intra extrahepatic biliary ductal dilatation. Unremarkable appearance of the bilateral kidneys. No focal solid lesion, hydronephrosis or nephrolithiasis. No evidence of bowel obstruction or focal bowel wall thickening. Pelvic floor laxity is noted incidentally. No free fluid or suspicious adenopathy. Pelvis: Pelvic floor laxity. Unremarkable appearance of the bladder. No free fluid or suspicious adenopathy. Bones/Soft Tissues: No acute fracture or aggressive appearing lytic or blastic osseous lesion. Incidental note is made of incomplete fusion of the inferior articulating process on the right at L2. Vascular: No significant atherosclerotic vascular disease, aneurysmal dilatation or acute abnormality. IMPRESSION: CT CHEST No acute injury to the thorax. Unremarkable CT appearance of the chest. CT ABD/PELVIS No acute injury to the abdomen or pelvis. Surgical changes of prior cholecystectomy and gastric bypass surgery without evidence of complication. Mild hepatic steatosis. Pelvic floor laxity. Electronically Signed   By: Malachy Moan M.D.   On: 11/07/2015 09:44   I have personally reviewed and evaluated these images and lab results as part of my medical decision-making.   EKG Interpretation None      MDM   Final diagnoses:  Multiple contusions    No internal injuries. No fractures. Normal head CT. Normal mental status and exam. Plan is home. Symptomatically treatment.    Rolland Porter, MD 11/07/15 1210  Rolland Porter, MD 11/07/15 1213  Rolland Porter, MD 11/07/15 [phone]

## 2015-12-07 ENCOUNTER — Ambulatory Visit: Payer: Federal, State, Local not specified - PPO | Admitting: Anesthesiology

## 2015-12-10 ENCOUNTER — Ambulatory Visit: Payer: Federal, State, Local not specified - PPO | Admitting: Anesthesiology
